# Patient Record
Sex: Female | Born: 1940 | Race: Black or African American | Hispanic: No | Marital: Married | Smoking: Former smoker
Health system: Southern US, Community
[De-identification: ages and names within clinical notes are randomized; demographics above are authoritative.]

## PROBLEM LIST (undated history)

## (undated) DIAGNOSIS — L4050 Arthropathic psoriasis, unspecified: Secondary | ICD-10-CM

## (undated) DIAGNOSIS — R600 Localized edema: Secondary | ICD-10-CM

## (undated) DIAGNOSIS — J45909 Unspecified asthma, uncomplicated: Secondary | ICD-10-CM

## (undated) DIAGNOSIS — I10 Essential (primary) hypertension: Secondary | ICD-10-CM

## (undated) DIAGNOSIS — J302 Other seasonal allergic rhinitis: Secondary | ICD-10-CM

## (undated) DIAGNOSIS — R638 Other symptoms and signs concerning food and fluid intake: Secondary | ICD-10-CM

## (undated) DIAGNOSIS — J449 Chronic obstructive pulmonary disease, unspecified: Secondary | ICD-10-CM

## (undated) DIAGNOSIS — K5730 Diverticulosis of large intestine without perforation or abscess without bleeding: Secondary | ICD-10-CM

## (undated) DIAGNOSIS — Z780 Asymptomatic menopausal state: Secondary | ICD-10-CM

## (undated) DIAGNOSIS — K219 Gastro-esophageal reflux disease without esophagitis: Secondary | ICD-10-CM

## (undated) DIAGNOSIS — Z87442 Personal history of urinary calculi: Secondary | ICD-10-CM

## (undated) DIAGNOSIS — T4145XA Adverse effect of unspecified anesthetic, initial encounter: Secondary | ICD-10-CM

## (undated) DIAGNOSIS — Z8619 Personal history of other infectious and parasitic diseases: Secondary | ICD-10-CM

## (undated) DIAGNOSIS — M1990 Unspecified osteoarthritis, unspecified site: Secondary | ICD-10-CM

## (undated) DIAGNOSIS — R32 Unspecified urinary incontinence: Secondary | ICD-10-CM

## (undated) DIAGNOSIS — N3281 Overactive bladder: Secondary | ICD-10-CM

## (undated) DIAGNOSIS — L900 Lichen sclerosus et atrophicus: Secondary | ICD-10-CM

## (undated) DIAGNOSIS — T8859XA Other complications of anesthesia, initial encounter: Secondary | ICD-10-CM

## (undated) HISTORY — PX: GANGLION CYST EXCISION: SHX1691

## (undated) HISTORY — PX: VEIN SURGERY: SHX48

## (undated) HISTORY — DX: Lichen sclerosus et atrophicus: L90.0

## (undated) HISTORY — PX: OTHER SURGICAL HISTORY: SHX169

## (undated) HISTORY — PX: FRACTURE SURGERY: SHX138

## (undated) HISTORY — PX: VAGINAL HYSTERECTOMY: SUR661

## (undated) HISTORY — DX: Chronic obstructive pulmonary disease, unspecified: J44.9

## (undated) HISTORY — DX: Gastro-esophageal reflux disease without esophagitis: K21.9

## (undated) HISTORY — DX: Arthropathic psoriasis, unspecified: L40.50

## (undated) HISTORY — DX: Personal history of other infectious and parasitic diseases: Z86.19

## (undated) HISTORY — DX: Other symptoms and signs concerning food and fluid intake: R63.8

## (undated) HISTORY — DX: Essential (primary) hypertension: I10

## (undated) HISTORY — DX: Unspecified asthma, uncomplicated: J45.909

## (undated) HISTORY — PX: JOINT REPLACEMENT: SHX530

## (undated) HISTORY — DX: Overactive bladder: N32.81

## (undated) HISTORY — DX: Other seasonal allergic rhinitis: J30.2

## (undated) HISTORY — DX: Unspecified osteoarthritis, unspecified site: M19.90

## (undated) HISTORY — DX: Asymptomatic menopausal state: Z78.0

## (undated) HISTORY — PX: TONSILLECTOMY: SUR1361

---

## 2007-02-26 ENCOUNTER — Ambulatory Visit: Payer: Self-pay | Admitting: Specialist

## 2007-08-20 ENCOUNTER — Ambulatory Visit: Payer: Self-pay | Admitting: Unknown Physician Specialty

## 2008-06-08 ENCOUNTER — Ambulatory Visit: Payer: Self-pay | Admitting: Internal Medicine

## 2008-08-11 ENCOUNTER — Other Ambulatory Visit: Payer: Self-pay

## 2008-08-11 ENCOUNTER — Ambulatory Visit: Payer: Self-pay | Admitting: Cardiovascular Disease

## 2008-08-11 ENCOUNTER — Ambulatory Visit: Payer: Self-pay | Admitting: Unknown Physician Specialty

## 2008-08-18 ENCOUNTER — Ambulatory Visit: Payer: Self-pay | Admitting: Unknown Physician Specialty

## 2009-04-29 ENCOUNTER — Ambulatory Visit: Payer: Self-pay | Admitting: Unknown Physician Specialty

## 2009-06-28 ENCOUNTER — Ambulatory Visit: Payer: Self-pay | Admitting: Unknown Physician Specialty

## 2010-02-04 ENCOUNTER — Ambulatory Visit: Payer: Self-pay

## 2010-04-12 ENCOUNTER — Ambulatory Visit: Payer: Self-pay | Admitting: Pain Medicine

## 2010-06-16 ENCOUNTER — Ambulatory Visit: Payer: Self-pay | Admitting: Dermatology

## 2013-01-19 ENCOUNTER — Ambulatory Visit: Payer: Self-pay | Admitting: Unknown Physician Specialty

## 2013-05-04 ENCOUNTER — Emergency Department: Payer: Self-pay | Admitting: Emergency Medicine

## 2013-06-18 ENCOUNTER — Ambulatory Visit: Payer: Self-pay | Admitting: Unknown Physician Specialty

## 2013-07-21 ENCOUNTER — Ambulatory Visit: Payer: Self-pay | Admitting: Unknown Physician Specialty

## 2014-02-28 DIAGNOSIS — I10 Essential (primary) hypertension: Secondary | ICD-10-CM | POA: Insufficient documentation

## 2014-02-28 DIAGNOSIS — R609 Edema, unspecified: Secondary | ICD-10-CM | POA: Insufficient documentation

## 2014-02-28 DIAGNOSIS — E785 Hyperlipidemia, unspecified: Secondary | ICD-10-CM | POA: Insufficient documentation

## 2014-02-28 DIAGNOSIS — J3089 Other allergic rhinitis: Secondary | ICD-10-CM | POA: Insufficient documentation

## 2014-02-28 DIAGNOSIS — J45909 Unspecified asthma, uncomplicated: Secondary | ICD-10-CM | POA: Insufficient documentation

## 2014-02-28 DIAGNOSIS — E538 Deficiency of other specified B group vitamins: Secondary | ICD-10-CM | POA: Insufficient documentation

## 2014-02-28 DIAGNOSIS — L4050 Arthropathic psoriasis, unspecified: Secondary | ICD-10-CM | POA: Insufficient documentation

## 2014-02-28 DIAGNOSIS — K219 Gastro-esophageal reflux disease without esophagitis: Secondary | ICD-10-CM | POA: Insufficient documentation

## 2014-03-08 DIAGNOSIS — M1990 Unspecified osteoarthritis, unspecified site: Secondary | ICD-10-CM | POA: Insufficient documentation

## 2014-10-01 DIAGNOSIS — IMO0002 Reserved for concepts with insufficient information to code with codable children: Secondary | ICD-10-CM | POA: Insufficient documentation

## 2014-11-04 LAB — HM MAMMOGRAPHY

## 2015-10-11 ENCOUNTER — Encounter: Payer: Self-pay | Admitting: Obstetrics and Gynecology

## 2015-10-26 ENCOUNTER — Ambulatory Visit (INDEPENDENT_AMBULATORY_CARE_PROVIDER_SITE_OTHER): Payer: Medicare Other | Admitting: Obstetrics and Gynecology

## 2015-10-26 ENCOUNTER — Encounter: Payer: Self-pay | Admitting: Obstetrics and Gynecology

## 2015-10-26 VITALS — BP 140/84 | HR 77 | Ht 63.0 in | Wt 216.4 lb

## 2015-10-26 DIAGNOSIS — L900 Lichen sclerosus et atrophicus: Secondary | ICD-10-CM | POA: Diagnosis not present

## 2015-10-26 DIAGNOSIS — Z124 Encounter for screening for malignant neoplasm of cervix: Secondary | ICD-10-CM

## 2015-10-26 DIAGNOSIS — N952 Postmenopausal atrophic vaginitis: Secondary | ICD-10-CM

## 2015-10-26 DIAGNOSIS — Z1239 Encounter for other screening for malignant neoplasm of breast: Secondary | ICD-10-CM | POA: Diagnosis not present

## 2015-10-26 DIAGNOSIS — Z1211 Encounter for screening for malignant neoplasm of colon: Secondary | ICD-10-CM | POA: Diagnosis not present

## 2015-10-26 DIAGNOSIS — N3289 Other specified disorders of bladder: Secondary | ICD-10-CM | POA: Diagnosis not present

## 2015-10-26 MED ORDER — ESTRADIOL 0.1 MG/GM VA CREA: 0.5000 | TOPICAL_CREAM | Freq: Every day | VAGINAL | Status: DC

## 2015-10-26 NOTE — Progress Notes (Signed)
Patient ID: Emily Fox, female   DOB: 04-14-1941, 74 y.o.   MRN: [id number] ANNUAL PREVENTATIVE CARE GYN  ENCOUNTER NOTE  Subjective:       Emily Fox is a 74 y.o. Para [id number]. female here for follow-up on lichen sclerosis and Medicare physical.  Current complaints: 1.  Lichen sclerosis-patient is intermittently using the Randall name Temovate ointment for control of symptoms.  She has not had a flare of vulvar irritation recently.  2.  Vaginal atrophy-patient is using Estrace cream biweekly. 3.  Unstable bladder-taking oxybutynin 5 mg daily Gynecologic History No LMP recorded. Patient has had a hysterectomy.TVH Contraception: status post hysterectomy Last Pap: no pap needed. Results were: normal Last mammogram: 11/05/2014 birad 1. Results were: normal  Obstetric History OB History  Gravida Para Term Preterm AB SAB TAB Ectopic Multiple Living  0 0 0 0 0 0 3    # Outcome Date GA Lbr Len/2nd Weight Sex Delivery Anes PTL Lv  3 Term           2 Term           1 Term               Past Medical History  Diagnosis Date  . Psoriatic arthritis (HCC)   . Osteoarthritis   . History of shingles   . GERD (gastroesophageal reflux disease)   . Hypertension   . Asthma   . Seasonal allergies   . COPD (chronic obstructive pulmonary disease) (HCC)   . Lichen sclerosus   . Increased BMI   . Menopause   . Overactive bladder     Past Surgical History  Procedure Laterality Date  . Knee replacement    . Vaginal hysterectomy    . Vein surgery    . Tonsillectomy    . Ganglion cyst excision      Current Outpatient Prescriptions on File Prior to Visit  Medication Sig Dispense Refill  . aspirin 81 MG tablet Take 81 mg by mouth daily.    . cetirizine (ZYRTEC) 10 MG tablet Take 10 mg by mouth daily.    . clobetasol ointment (TEMOVATE) 0.05 % Apply 1 application topically 2 (two) times daily.    . fluticasone (FLONASE) 50 MCG/ACT nasal spray Place into both nostrils daily.    .  Fluticasone-Salmeterol (ADVAIR DISKUS) 100-50 MCG/DOSE AEPB Inhale 1 puff into the lungs 2 (two) times daily.    . folic acid (FOLVITE) 1 MG tablet Take 1 mg by mouth daily.    Marland Kitchen inFLIXimab (REMICADE) 100 MG injection Inject into the vein.    . methyldopa (ALDOMET) 500 MG tablet Take 500 mg by mouth 3 (three) times daily.    . montelukast (SINGULAIR) 10 MG tablet Take 10 mg by mouth at bedtime.    Marland Kitchen omeprazole (PRILOSEC) 40 MG capsule Take 40 mg by mouth daily.    Marland Kitchen oxybutynin (DITROPAN-XL) 5 MG 24 hr tablet Take 5 mg by mouth at bedtime.    . triamterene-hydrochlorothiazide (DYAZIDE) 37.5-25 MG capsule Take 1 capsule by mouth daily.    . methotrexate (RHEUMATREX) 2.5 MG tablet Take 2.5 mg by mouth once a week. Caution:Chemotherapy. Protect from light.     No current facility-administered medications on file prior to visit.    Allergies  Allergen Reactions  . Hylan G-F 20 Shortness Of Breath  . Penicillins Swelling    Social History   Social History  . Marital Status: Married    Spouse Name: N/A  .  Number of Children: N/A  . Years of Education: N/A   Occupational History  . Not on file.   Social History Main Topics  . Smoking status: Former Games developermoker  . Smokeless tobacco: Not on file  . Alcohol Use: No  . Drug Use: No  . Sexual Activity: Not Currently   Other Topics Concern  . Not on file   Social History Narrative    Family History  Problem Relation Age of Onset  . Cancer Neg Hx   . Diabetes Neg Hx   . Heart disease Neg Hx     The following portions of the patient's history were reviewed and updated as appropriate: allergies, current medications, past family history, past medical history, past social history, past surgical history and problem list.  Review of Systems ROS Review of Systems - General ROS: negative for - chills, fatigue, fever, hot flashes, night sweats, weight gain or weight loss Psychological ROS: negative for - anxiety, decreased libido,  depression, mood swings, physical abuse or sexual abuse Ophthalmic ROS: negative for - blurry vision, eye pain or loss of vision ENT ROS: negative for - headaches, hearing change, visual changes or vocal changes Allergy and Immunology ROS: negative for - hives, itchy/watery eyes or seasonal allergies Hematological and Lymphatic ROS: negative for - bleeding problems, bruising, swollen lymph nodes or weight loss Endocrine ROS: negative for - galactorrhea, hair pattern changes, hot flashes, malaise/lethargy, mood swings, palpitations, polydipsia/polyuria, skin changes, temperature intolerance or unexpected weight changes Breast ROS: negative for - new or changing breast lumps or nipple discharge Respiratory ROS: negative for - cough or shortness of breath Cardiovascular ROS: negative for - chest pain, irregular heartbeat, palpitations or shortness of breath Gastrointestinal ROS: no abdominal pain, change in bowel habits, or black or bloody stools Genito-Urinary ROS: no dysuria, trouble voiding, or hematuria Musculoskeletal ROS: negative for - joint pain or joint stiffness Neurological ROS: negative for - bowel and bladder control changes Dermatological ROS: negative for rash and skin lesion changes   Objective:   BP 140/84 mmHg  Pulse 77  Ht 5\' 3"  (1.6 m)  Wt 216 lb 6.4 oz (98.158 kg)  BMI 38.34 kg/m2 CONSTITUTIONAL: Well-developed, well-nourished female in no acute distress.  PSYCHIATRIC: Normal mood and affect. Normal behavior. Normal judgment and thought content. NEUROLGIC: Alert and oriented to person, place, and time. Normal muscle tone coordination. No cranial nerve deficit noted. HENT:  Normocephalic, atraumatic, External right and left ear normal. Oropharynx is clear and moist EYES: Conjunctivae  normal. No scleral icterus.  NECK: Normal range of motion, supple, no masses.  Normal thyroid.  SKIN: Skin is warm and dry. No rash noted. Not diaphoretic. No erythema. No  pallor. CARDIOVASCULAR: Not examined RESPIRATORY: Not examined BREASTS: Symmetric in size. No masses, skin changes, nipple drainage, or lymphadenopathy. ABDOMEN: Soft, normal bowel sounds, no distention noted.  No tenderness, rebound or guarding.  BLADDER: Normal PELVIC:  External Genitalia: Atrophic changes present; periclitoral leukoplakia and labia minora.  Small skin breakdown with linear changes noted at the posterior fourchette.  Single digit vaginal exam is notable for a narrowed introitus without palpable pelvic or adnexal mass  BUS: Normal  Vagina: Atrophic changes; no palpable masses  Cervix: Surgically absent  Uterus:Surgically absent  Adnexa: Normal  RV: External Exam NormaI, No Rectal Masses and Normal Sphincter tone  MUSCULOSKELETAL: Normal range of motion. No tenderness.  No cyanosis, clubbing, or edema.  2+ distal pulses. LYMPHATIC: No Axillary, Supraclavicular, or Inguinal Adenopathy.    Assessment:  Annual gynecologic examination 74 y.o. Contraception: status post hysterectomy(TVH) bmi-38 Lichen sclerosus-stable. Vaginal atrophy-stable  Plan:  Pap: Not needed Mammogram: Ordered Stool Guaiac Testing:  Ordered Labs: thur pcp Routine preventative health maintenance measures emphasized: Exercise/Diet/Weight control, Tobacco Warnings and Alcohol/Substance use risks Continue Temovate ointment twice a week. Continue Estrace cream twice a week Return to Clinic - 1 YearFor Medicare physical Return to clinic.-6 months-Follow-up on lichen sclerosus   Herold Harms, MD  Note: This dictation was prepared with Dragon dictation along with smaller phrase technology. Any transcriptional errors that result from this process are unintentional.

## 2015-10-27 ENCOUNTER — Encounter: Payer: Self-pay | Admitting: Obstetrics and Gynecology

## 2015-10-27 DIAGNOSIS — N952 Postmenopausal atrophic vaginitis: Secondary | ICD-10-CM | POA: Insufficient documentation

## 2015-10-27 DIAGNOSIS — L900 Lichen sclerosus et atrophicus: Secondary | ICD-10-CM | POA: Insufficient documentation

## 2015-10-27 DIAGNOSIS — N3289 Other specified disorders of bladder: Secondary | ICD-10-CM | POA: Insufficient documentation

## 2015-10-27 NOTE — Patient Instructions (Addendum)
1.  Continue with Temovate ointment twice a week. 2.  Continue with Estrace cream intravaginally twice a week. 3.  Return in 6 months for follow-up. 4.  Return in 1 year for a Medicare physical 5.  Continue oxybutynin SR 5 mg daily 6.  Mammogram ordered. 7.  Stool guaiac cards ordered.

## 2015-10-31 ENCOUNTER — Other Ambulatory Visit: Payer: Self-pay | Admitting: Internal Medicine

## 2015-10-31 DIAGNOSIS — R109 Unspecified abdominal pain: Secondary | ICD-10-CM

## 2015-11-03 ENCOUNTER — Other Ambulatory Visit: Payer: Self-pay | Admitting: Obstetrics and Gynecology

## 2015-11-04 ENCOUNTER — Ambulatory Visit
Admission: RE | Admit: 2015-11-04 | Discharge: 2015-11-04 | Disposition: A | Discharge status: Home / Self Care | Payer: Medicare Other | Source: Ambulatory Visit | Attending: Internal Medicine | Admitting: Internal Medicine

## 2015-11-04 DIAGNOSIS — I708 Atherosclerosis of other arteries: Secondary | ICD-10-CM | POA: Insufficient documentation

## 2015-11-04 DIAGNOSIS — R109 Unspecified abdominal pain: Secondary | ICD-10-CM | POA: Diagnosis present

## 2015-11-04 MED ORDER — IOHEXOL 300 MG/ML  SOLN
100.0000 mL | Freq: Once | INTRAMUSCULAR | Status: AC | PRN
  Administered 2015-11-04: 100 mL via INTRAVENOUS

## 2015-11-09 LAB — FECAL OCCULT BLOOD, IMMUNOCHEMICAL: Fecal Occult Bld: NEGATIVE

## 2015-12-29 ENCOUNTER — Telehealth: Payer: Self-pay | Admitting: Obstetrics and Gynecology

## 2015-12-29 MED ORDER — OXYBUTYNIN CHLORIDE ER 5 MG PO TB24: 5.0000 mg | ORAL_TABLET | Freq: Every day | ORAL | Status: DC

## 2015-12-29 NOTE — Telephone Encounter (Signed)
Patient called requesting a refill on oxybutinin sent to [facility].thanks

## 2015-12-29 NOTE — Telephone Encounter (Signed)
Pt aware med erx. 

## 2016-04-25 ENCOUNTER — Ambulatory Visit (INDEPENDENT_AMBULATORY_CARE_PROVIDER_SITE_OTHER): Payer: Medicare Other | Admitting: Obstetrics and Gynecology

## 2016-04-25 ENCOUNTER — Encounter: Payer: Self-pay | Admitting: Obstetrics and Gynecology

## 2016-04-25 VITALS — BP 148/80 | HR 86 | Ht 63.0 in | Wt 219.2 lb

## 2016-04-25 DIAGNOSIS — N952 Postmenopausal atrophic vaginitis: Secondary | ICD-10-CM

## 2016-04-25 DIAGNOSIS — N3289 Other specified disorders of bladder: Secondary | ICD-10-CM | POA: Diagnosis not present

## 2016-04-25 DIAGNOSIS — L900 Lichen sclerosus et atrophicus: Secondary | ICD-10-CM

## 2016-04-25 MED ORDER — CLOBETASOL PROPIONATE 0.05 % EX OINT: 1.0000 "application " | TOPICAL_OINTMENT | Freq: Two times a day (BID) | CUTANEOUS | Status: DC

## 2016-04-25 NOTE — Patient Instructions (Signed)
1. Continue Temovate ointment once or twice a week as needed 2. Continue Estrace cream intravaginal twice a week 3. Return in 6 months for follow-up

## 2016-04-25 NOTE — Progress Notes (Signed)
Chief complaint: 1.  Lichen sclerosus. 2.  Vaginal atrophy. 3.  Unstable bladder  Patient presents for 6 month follow-up.  She is only using the Temovate ointment twice a month instead of twice a week as recommended because of cost constraints for the medication.  She is using Estrace cream, intravaginal once or twice a week.  She is on oxybutynin for unstable bladder.  Lichen sclerosis, symptoms are present without recent exacerbation. Vaginal atrophy remains stable without significant patient complaints. Bladder leakage is minimal.  No LMP recorded. Patient has had a hysterectomy.TVH Contraception: status post hysterectomy Last Pap: no pap needed. Results were: normal Last mammogram: 11/05/2014 birad 1. Results were: normal  Past Medical History  Diagnosis Date  . Psoriatic arthritis (HCC)   . Osteoarthritis   . History of shingles   . GERD (gastroesophageal reflux disease)   . Hypertension   . Asthma   . Seasonal allergies   . COPD (chronic obstructive pulmonary disease) (HCC)   . Lichen sclerosus   . Increased BMI   . Menopause   . Overactive bladder    Past Surgical History  Procedure Laterality Date  . Knee replacement    . Vaginal hysterectomy    . Vein surgery    . Tonsillectomy    . Ganglion cyst excision      OBJECTIVE: BP 148/80 mmHg  Pulse 86  Ht 5\' 3"  (1.6 m)  Wt 219 lb 3.2 oz (99.428 kg)  BMI 38.84 kg/m2 ]ABDOMEN: Soft, normal bowel sounds, no distention noted. No tenderness, rebound or guarding.  BLADDER: Normal PELVIC: External Genitalia: Atrophic changes present; periclitoral leukoplakia and labia minora-Minimal. Small skin breakdown with linear changes noted at the posterior fourchette. Single digit vaginal exam is notable for a narrowed introitus without palpable pelvic or adnexal mass BUS: Normal Vagina: Atrophic changes; no palpable masses Cervix: Surgically  absent Uterus:Surgically absent Adnexa: Normal RV: External Exam NormaI  ASSESSMENT: 1. Lichen sclerosus, stable despite reduced frequency of Temovate ointment therapy.  Patient only able to use name brand medication as generics are ineffective. 2.  Vaginal atrophy, stable. 3.  Unstable bladder, without recent change in symptoms  PLAN: 1.  Increase Temovate ointment to at least weekly application. 2.  Continue Estrace cream for vaginal once or twice a week. 3.  Continue with oxybutynin 5 mg at bedtime. 4.  Return in 6 months for follow-up  A total of 15 minutes were spent face-to-face with the patient during this encounter and over half of that time dealt with counseling and coordination of care.  Herold HarmsMartin A Sonyia Muro, MD  Note: This dictation was prepared with Dragon dictation along with smaller phrase technology. Any transcriptional errors that result from this process are unintentional.

## 2016-09-04 ENCOUNTER — Other Ambulatory Visit: Payer: Self-pay

## 2016-09-04 MED ORDER — OXYBUTYNIN CHLORIDE ER 5 MG PO TB24: 5.0000 mg | ORAL_TABLET | Freq: Every day | ORAL | 6 refills | Status: DC

## 2016-10-22 NOTE — Progress Notes (Signed)
Patient ID: Emily Fox, female   DOB: June 20, 1941, 75 y.o.   MRN: [id number] ANNUAL PREVENTATIVE CARE GYN  ENCOUNTER NOTE  Subjective:       Emily Fox is a 75 y.o. Para [id number]. female here for follow-up on lichen sclerosis and Medicare physical.  Current complaints: 1.  Lichen sclerosis-patient is intermittently using the Brand name Temovate ointment for control of symptoms.  She has not had a flare of vulvar irritation recently.  2.  Vaginal atrophy-patient is using Estrace cream biweekly. 3.  Unstable bladder-taking oxybutynin 5 mg daily  Gynecologic History No LMP recorded. Patient has had a hysterectomy.TVH Contraception: status post hysterectomy Last Pap: no pap needed. Results were: normal Last mammogram: 11/05/2014 birad 1. Results were: normal  Obstetric History OB History  Gravida Para Term Preterm AB Living  3 3 3  0 0 3  SAB TAB Ectopic Multiple Live Births  0 0 0 0      # Outcome Date GA Lbr Len/2nd Weight Sex Delivery Anes PTL Lv  3 Term           2 Term           1 Term               Past Medical History:  Diagnosis Date  . Asthma   . COPD (chronic obstructive pulmonary disease) (HCC)   . GERD (gastroesophageal reflux disease)   . History of shingles   . Hypertension   . Increased BMI   . Lichen sclerosus   . Menopause   . Osteoarthritis   . Overactive bladder   . Psoriatic arthritis (HCC)   . Seasonal allergies     Past Surgical History:  Procedure Laterality Date  . GANGLION CYST EXCISION    . knee replacement    . TONSILLECTOMY    . VAGINAL HYSTERECTOMY    . VEIN SURGERY      Current Outpatient Prescriptions on File Prior to Visit  Medication Sig Dispense Refill  . albuterol (PROAIR HFA) 108 (90 BASE) MCG/ACT inhaler Inhale into the lungs.    Marland Kitchen. aspirin 81 MG tablet Take 81 mg by mouth daily.    . cetirizine (ZYRTEC) 10 MG tablet Take 10 mg by mouth daily.    . clindamycin (CLEOCIN) 150 MG capsule once daily as needed (for teeth  cleaning).     . clobetasol ointment (TEMOVATE) 0.05 % Apply 1 application topically 2 (two) times daily. 30 g 1  . estradiol (ESTRACE) 0.1 MG/GM vaginal cream Place 0.5 Applicatorfuls vaginally at bedtime. 1/2 gram twice a week 42.5 g 3  . fluticasone (FLONASE) 50 MCG/ACT nasal spray Place into both nostrils daily.    . Fluticasone-Salmeterol (ADVAIR DISKUS) 100-50 MCG/DOSE AEPB Inhale 1 puff into the lungs 2 (two) times daily.    . folic acid (FOLVITE) 1 MG tablet Take 1 mg by mouth daily.    . hyoscyamine (LEVSIN) 0.125 MG/5ML ELIX Take 0.125 mg by mouth.    . inFLIXimab (REMICADE) 100 MG injection Inject into the vein.    . methotrexate (RHEUMATREX) 2.5 MG tablet Take 2.5 mg by mouth once a week. Caution:Chemotherapy. Protect from light.    . methyldopa (ALDOMET) 500 MG tablet Take 500 mg by mouth 3 (three) times daily.    . montelukast (SINGULAIR) 10 MG tablet Take 10 mg by mouth at bedtime.    . Multiple Vitamin (MULTI-VITAMINS) TABS Take by mouth.    . Omega-3 Fatty Acids (FISH OIL CONCENTRATE) 300 MG CAPS  Take by mouth.    Marland Kitchen. omeprazole (PRILOSEC) 40 MG capsule Take 40 mg by mouth daily.    Marland Kitchen. oxybutynin (DITROPAN-XL) 5 MG 24 hr tablet Take 1 tablet (5 mg total) by mouth at bedtime. 30 tablet 6  . potassium chloride (K-DUR) 10 MEQ tablet Take by mouth.    . ranitidine (ZANTAC) 300 MG tablet     . triamterene-hydrochlorothiazide (DYAZIDE) 37.5-25 MG capsule Take 1 capsule by mouth daily.     No current facility-administered medications on file prior to visit.     Allergies  Allergen Reactions  . Hylan G-F 20 Shortness Of Breath  . Penicillins Swelling    Social History   Social History  . Marital status: Married    Spouse name: N/A  . Number of children: N/A  . Years of education: N/A   Occupational History  . Not on file.   Social History Main Topics  . Smoking status: Former Games developermoker  . Smokeless tobacco: Not on file  . Alcohol use No  . Drug use: No  . Sexual  activity: Not Currently   Other Topics Concern  . Not on file   Social History Narrative  . No narrative on file    Family History  Problem Relation Age of Onset  . Cancer Neg Hx   . Diabetes Neg Hx   . Heart disease Neg Hx     The following portions of the patient's history were reviewed and updated as appropriate: allergies, current medications, past family history, past medical history, past social history, past surgical history and problem list.  Review of Systems ROS Review of Systems - General ROS: negative for - chills, fatigue, fever, hot flashes, night sweats, weight gain or weight loss Psychological ROS: negative for - anxiety, decreased libido, depression, mood swings, physical abuse or sexual abuse Ophthalmic ROS: negative for - blurry vision, eye pain or loss of vision ENT ROS: negative for - headaches, hearing change, visual changes or vocal changes Allergy and Immunology ROS: negative for - hives, itchy/watery eyes or seasonal allergies Hematological and Lymphatic ROS: negative for - bleeding problems, bruising, swollen lymph nodes or weight loss Endocrine ROS: negative for - galactorrhea, hair pattern changes, hot flashes, malaise/lethargy, mood swings, palpitations, polydipsia/polyuria, skin changes, temperature intolerance or unexpected weight changes Breast ROS: negative for - new or changing breast lumps or nipple discharge Respiratory ROS: negative for - cough or shortness of breath Cardiovascular ROS: negative for - chest pain, irregular heartbeat, palpitations or shortness of breath Gastrointestinal ROS: no abdominal pain, change in bowel habits, or black or bloody stools Genito-Urinary ROS: no dysuria, trouble voiding, or hematuria Musculoskeletal ROS: negative for - joint pain or joint stiffness Neurological ROS: negative for - bowel and bladder control changes Dermatological ROS: negative for rash and skin lesion changes   Objective:   BP (!) 141/80    Pulse 76   Ht 5\' 3"  (1.6 m)   Wt 219 lb (99.3 kg)   BMI 38.79 kg/m  CONSTITUTIONAL: Well-developed, well-nourished female in no acute distress.  PSYCHIATRIC: Normal mood and affect. Normal behavior. Normal judgment and thought content. NEUROLGIC: Alert and oriented to person, place, and time. Normal muscle tone coordination. No cranial nerve deficit noted. HENT:  Normocephalic, atraumatic EYES: Conjunctivae  normal. No scleral icterus.  NECK: Normal range of motion, supple, no masses.  Normal thyroid.  SKIN: Skin is warm and dry. No rash noted. Not diaphoretic. No erythema. No pallor. CARDIOVASCULAR: Not examined RESPIRATORY: Not examined BREASTS:  Symmetric in size. No masses, skin changes, nipple drainage, or lymphadenopathy. ABDOMEN: Soft, normal bowel sounds, no distention noted.  No tenderness, rebound or guarding.  BLADDER: Normal PELVIC:  External Genitalia: Atrophic changes present; periclitoral leukoplakia and labia minora agglutination present.  Minimal leukoplakia noted at the posterior fourchette.  Single digit vaginal exam is notable for a narrowed introitus without palpable pelvic or adnexal mass  BUS: Normal  Vagina: Atrophic changes; no palpable masses  Cervix: Surgically absent  Uterus:Surgically absent  Adnexa: Normal  RV: External Exam NormaI, No Rectal Masses and Normal Sphincter tone  MUSCULOSKELETAL: Normal range of motion. No tenderness.  No cyanosis, clubbing, or edema.  2+ distal pulses. LYMPHATIC: No Axillary, Supraclavicular, or Inguinal Adenopathy.    Assessment:   Annual gynecologic examination 75 y.o. Contraception: status post hysterectomy(TVH) bmi-38 Lichen sclerosus-stable. Vaginal atrophy-stable  Plan:  Pap: Not needed Mammogram: Ordered Stool Guaiac Testing:  Ordered Labs: thur pcp Routine preventative health maintenance measures emphasized: Exercise/Diet/Weight control, Tobacco Warnings and Alcohol/Substance use risks Continue Temovate  ointment twice a week. Continue Estrace cream twice a week Continue oxybutynin 5 mg daily at bedtime Return to Clinic - 1 YearFor Medicare physical Return to clinic.-6 months-Follow-up on lichen sclerosus   Crystal Hyacinth Meeker, CMA  Herold Harms, MD  Note: This dictation was prepared with Dragon dictation along with smaller phrase technology. Any transcriptional errors that result from this process are unintentional.

## 2016-10-24 ENCOUNTER — Encounter: Payer: Self-pay | Admitting: Obstetrics and Gynecology

## 2016-10-24 ENCOUNTER — Ambulatory Visit (INDEPENDENT_AMBULATORY_CARE_PROVIDER_SITE_OTHER): Payer: Medicare Other | Admitting: Obstetrics and Gynecology

## 2016-10-24 VITALS — BP 141/80 | HR 76 | Ht 63.0 in | Wt 219.0 lb

## 2016-10-24 DIAGNOSIS — Z124 Encounter for screening for malignant neoplasm of cervix: Secondary | ICD-10-CM

## 2016-10-24 DIAGNOSIS — Z1211 Encounter for screening for malignant neoplasm of colon: Secondary | ICD-10-CM

## 2016-10-24 DIAGNOSIS — Z1239 Encounter for other screening for malignant neoplasm of breast: Secondary | ICD-10-CM

## 2016-10-24 DIAGNOSIS — N3289 Other specified disorders of bladder: Secondary | ICD-10-CM | POA: Diagnosis not present

## 2016-10-24 DIAGNOSIS — N952 Postmenopausal atrophic vaginitis: Secondary | ICD-10-CM

## 2016-10-24 DIAGNOSIS — L900 Lichen sclerosus et atrophicus: Secondary | ICD-10-CM

## 2016-10-24 MED ORDER — OXYBUTYNIN CHLORIDE ER 5 MG PO TB24: 5.0000 mg | ORAL_TABLET | Freq: Every day | ORAL | 6 refills | Status: DC

## 2016-10-24 MED ORDER — CLOBETASOL PROPIONATE 0.05 % EX OINT: 1.0000 "application " | TOPICAL_OINTMENT | Freq: Two times a day (BID) | CUTANEOUS | 1 refills | Status: DC

## 2016-10-24 NOTE — Patient Instructions (Addendum)
1. No Pap smear is needed 2. Mammogram is ordered 3. Stool guaiac cards are given 4. Screening labs are to be obtained through primary care 5. Temovate ointment 0.05% is refilled and is to be applied topically twice a week 6. Return in 6 months for follow-up 7. Return in 1 year for annual Medicare physical 8. Continue Estrace cream intravaginal twice a week 9. Continue oxybutynin 5 mg daily at bedtime    Health Maintenance for Postmenopausal Women Introduction Menopause is a normal process in which your reproductive ability comes to an end. This process happens gradually over a span of months to years, usually between the ages of 86 and 82. Menopause is complete when you have missed 12 consecutive menstrual periods. It is important to talk with your health care provider about some of the most common conditions that affect postmenopausal women, such as heart disease, cancer, and bone loss (osteoporosis). Adopting a healthy lifestyle and getting preventive care can help to promote your health and wellness. Those actions can also lower your chances of developing some of these common conditions. What should I know about menopause? During menopause, you may experience a number of symptoms, such as:  Moderate-to-severe hot flashes.  Night sweats.  Decrease in sex drive.  Mood swings.  Headaches.  Tiredness.  Irritability.  Memory problems.  Insomnia. Choosing to treat or not to treat menopausal changes is an individual decision that you make with your health care provider. What should I know about hormone replacement therapy and supplements? Hormone therapy products are effective for treating symptoms that are associated with menopause, such as hot flashes and night sweats. Hormone replacement carries certain risks, especially as you become older. If you are thinking about using estrogen or estrogen with progestin treatments, discuss the benefits and risks with your health care  provider. What should I know about heart disease and stroke? Heart disease, heart attack, and stroke become more likely as you age. This may be due, in part, to the hormonal changes that your body experiences during menopause. These can affect how your body processes dietary fats, triglycerides, and cholesterol. Heart attack and stroke are both medical emergencies. There are many things that you can do to help prevent heart disease and stroke:  Have your blood pressure checked at least every 1-2 years. High blood pressure causes heart disease and increases the risk of stroke.  If you are 16-28 years old, ask your health care provider if you should take aspirin to prevent a heart attack or a stroke.  Do not use any tobacco products, including cigarettes, chewing tobacco, or electronic cigarettes. If you need help quitting, ask your health care provider.  It is important to eat a healthy diet and maintain a healthy weight.  Be sure to include plenty of vegetables, fruits, low-fat dairy products, and lean protein.  Avoid eating foods that are high in solid fats, added sugars, or salt (sodium).  Get regular exercise. This is one of the most important things that you can do for your health.  Try to exercise for at least 150 minutes each week. The type of exercise that you do should increase your heart rate and make you sweat. This is known as moderate-intensity exercise.  Try to do strengthening exercises at least twice each week. Do these in addition to the moderate-intensity exercise.  Know your numbers.Ask your health care provider to check your cholesterol and your blood glucose. Continue to have your blood tested as directed by your health care  provider. What should I know about cancer screening? There are several types of cancer. Take the following steps to reduce your risk and to catch any cancer development as early as possible. Breast Cancer  Practice breast self-awareness.  This  means understanding how your breasts normally appear and feel.  It also means doing regular breast self-exams. Let your health care provider know about any changes, no matter how small.  If you are 89 or older, have a clinician do a breast exam (clinical breast exam or CBE) every year. Depending on your age, family history, and medical history, it may be recommended that you also have a yearly breast X-ray (mammogram).  If you have a family history of breast cancer, talk with your health care provider about genetic screening.  If you are at high risk for breast cancer, talk with your health care provider about having an MRI and a mammogram every year.  Breast cancer (BRCA) gene test is recommended for women who have family members with BRCA-related cancers. Results of the assessment will determine the need for genetic counseling and BRCA1 and for BRCA2 testing. BRCA-related cancers include these types:  Breast. This occurs in males or females.  Ovarian.  Tubal. This may also be called fallopian tube cancer.  Cancer of the abdominal or pelvic lining (peritoneal cancer).  Prostate.  Pancreatic. Cervical, Uterine, and Ovarian Cancer  Your health care provider may recommend that you be screened regularly for cancer of the pelvic organs. These include your ovaries, uterus, and vagina. This screening involves a pelvic exam, which includes checking for microscopic changes to the surface of your cervix (Pap test).  For women ages 21-65, health care providers may recommend a pelvic exam and a Pap test every three years. For women ages 67-65, they may recommend the Pap test and pelvic exam, combined with testing for human papilloma virus (HPV), every five years. Some types of HPV increase your risk of cervical cancer. Testing for HPV may also be done on women of any age who have unclear Pap test results.  Other health care providers may not recommend any screening for nonpregnant women who are  considered low risk for pelvic cancer and have no symptoms. Ask your health care provider if a screening pelvic exam is right for you.  If you have had past treatment for cervical cancer or a condition that could lead to cancer, you need Pap tests and screening for cancer for at least 20 years after your treatment. If Pap tests have been discontinued for you, your risk factors (such as having a new sexual partner) need to be reassessed to determine if you should start having screenings again. Some women have medical problems that increase the chance of getting cervical cancer. In these cases, your health care provider may recommend that you have screening and Pap tests more often.  If you have a family history of uterine cancer or ovarian cancer, talk with your health care provider about genetic screening.  If you have vaginal bleeding after reaching menopause, tell your health care provider.  There are currently no reliable tests available to screen for ovarian cancer. Lung Cancer  Lung cancer screening is recommended for adults 14-87 years old who are at high risk for lung cancer because of a history of smoking. A yearly low-dose CT scan of the lungs is recommended if you:  Currently smoke.  Have a history of at least 30 pack-years of smoking and you currently smoke or have quit within the  past 15 years. A pack-year is smoking an average of one pack of cigarettes per day for one year. Yearly screening should:  Continue until it has been 15 years since you quit.  Stop if you develop a health problem that would prevent you from having lung cancer treatment. Colorectal Cancer  This type of cancer can be detected and can often be prevented.  Routine colorectal cancer screening usually begins at age 12 and continues through age 19.  If you have risk factors for colon cancer, your health care provider may recommend that you be screened at an earlier age.  If you have a family history of  colorectal cancer, talk with your health care provider about genetic screening.  Your health care provider may also recommend using home test kits to check for hidden blood in your stool.  A small camera at the end of a tube can be used to examine your colon directly (sigmoidoscopy or colonoscopy). This is done to check for the earliest forms of colorectal cancer.  Direct examination of the colon should be repeated every 5-10 years until age 25. However, if early forms of precancerous polyps or small growths are found or if you have a family history or genetic risk for colorectal cancer, you may need to be screened more often. Skin Cancer  Check your skin from head to toe regularly.  Monitor any moles. Be sure to tell your health care provider:  About any new moles or changes in moles, especially if there is a change in a mole's shape or color.  If you have a mole that is larger than the size of a pencil eraser.  If any of your family members has a history of skin cancer, especially at a young age, talk with your health care provider about genetic screening.  Always use sunscreen. Apply sunscreen liberally and repeatedly throughout the day.  Whenever you are outside, protect yourself by wearing long sleeves, pants, a wide-brimmed hat, and sunglasses. What should I know about osteoporosis? Osteoporosis is a condition in which bone destruction happens more quickly than new bone creation. After menopause, you may be at an increased risk for osteoporosis. To help prevent osteoporosis or the bone fractures that can happen because of osteoporosis, the following is recommended:  If you are 35-58 years old, get at least 1,000 mg of calcium and at least 600 mg of vitamin D per day.  If you are older than age 86 but younger than age 53, get at least 1,200 mg of calcium and at least 600 mg of vitamin D per day.  If you are older than age 31, get at least 1,200 mg of calcium and at least 800 mg of  vitamin D per day. Smoking and excessive alcohol intake increase the risk of osteoporosis. Eat foods that are rich in calcium and vitamin D, and do weight-bearing exercises several times each week as directed by your health care provider. What should I know about how menopause affects my mental health? Depression may occur at any age, but it is more common as you become older. Common symptoms of depression include:  Low or sad mood.  Changes in sleep patterns.  Changes in appetite or eating patterns.  Feeling an overall lack of motivation or enjoyment of activities that you previously enjoyed.  Frequent crying spells. Talk with your health care provider if you think that you are experiencing depression. What should I know about immunizations? It is important that you get and maintain  your immunizations. These include:  Tetanus, diphtheria, and pertussis (Tdap) booster vaccine.  Influenza every year before the flu season begins.  Pneumonia vaccine.  Shingles vaccine. Your health care provider may also recommend other immunizations. This information is not intended to replace advice given to you by your health care provider. Make sure you discuss any questions you have with your health care provider. Document Released: 12/28/2005 Document Revised: 05/25/2016 Document Reviewed: 08/09/2015  2017 [employer]vier

## 2016-11-14 LAB — FECAL OCCULT BLOOD, IMMUNOCHEMICAL: Fecal Occult Bld: NEGATIVE

## 2016-11-21 ENCOUNTER — Encounter
Admission: RE | Admit: 2016-11-21 | Discharge: 2016-11-21 | Disposition: A | Discharge status: Home / Self Care | Payer: Medicare Other | Source: Ambulatory Visit | Attending: Orthopedic Surgery | Admitting: Orthopedic Surgery

## 2016-11-21 DIAGNOSIS — Z01818 Encounter for other preprocedural examination: Secondary | ICD-10-CM | POA: Insufficient documentation

## 2016-11-21 DIAGNOSIS — I10 Essential (primary) hypertension: Secondary | ICD-10-CM | POA: Diagnosis not present

## 2016-11-21 HISTORY — DX: Localized edema: R60.0

## 2016-11-21 HISTORY — DX: Other complications of anesthesia, initial encounter: T88.59XA

## 2016-11-21 HISTORY — DX: Unspecified urinary incontinence: R32

## 2016-11-21 HISTORY — DX: Adverse effect of unspecified anesthetic, initial encounter: T41.45XA

## 2016-11-21 HISTORY — DX: Diverticulosis of large intestine without perforation or abscess without bleeding: K57.30

## 2016-11-21 LAB — URINALYSIS, ROUTINE W REFLEX MICROSCOPIC
BACTERIA UA: NONE SEEN
Bilirubin Urine: NEGATIVE
GLUCOSE, UA: NEGATIVE mg/dL
Hgb urine dipstick: NEGATIVE
Ketones, ur: 5 mg/dL — AB
Nitrite: NEGATIVE
PH: 7 (ref 5.0–8.0)
PROTEIN: 30 mg/dL — AB
Specific Gravity, Urine: 1.021 (ref 1.005–1.030)

## 2016-11-21 LAB — TYPE AND SCREEN
ABO/RH(D): O POS
Antibody Screen: NEGATIVE

## 2016-11-21 LAB — SURGICAL PCR SCREEN
MRSA, PCR: NEGATIVE
STAPHYLOCOCCUS AUREUS: NEGATIVE

## 2016-11-21 LAB — CBC
HCT: 37.3 % (ref 35.0–47.0)
Hemoglobin: 12.7 g/dL (ref 12.0–16.0)
MCH: 28.2 pg (ref 26.0–34.0)
MCHC: 33.9 g/dL (ref 32.0–36.0)
MCV: 83 fL (ref 80.0–100.0)
PLATELETS: 212 10*3/uL (ref 150–440)
RBC: 4.5 MIL/uL (ref 3.80–5.20)
RDW: 15.1 % — AB (ref 11.5–14.5)
WBC: 7.5 10*3/uL (ref 3.6–11.0)

## 2016-11-21 LAB — COMPREHENSIVE METABOLIC PANEL
ALT: 13 U/L — ABNORMAL LOW (ref 14–54)
ANION GAP: 7 (ref 5–15)
AST: 25 U/L (ref 15–41)
Albumin: 4.2 g/dL (ref 3.5–5.0)
Alkaline Phosphatase: 61 U/L (ref 38–126)
BUN: 16 mg/dL (ref 6–20)
CHLORIDE: 103 mmol/L (ref 101–111)
CO2: 29 mmol/L (ref 22–32)
Calcium: 9.4 mg/dL (ref 8.9–10.3)
Creatinine, Ser: 0.71 mg/dL (ref 0.44–1.00)
GFR calc non Af Amer: >60 mL/min (ref >60)
Glucose, Bld: 98 mg/dL (ref 65–99)
Potassium: 3.6 mmol/L (ref 3.5–5.1)
SODIUM: 139 mmol/L (ref 135–145)
Total Bilirubin: 0.5 mg/dL (ref 0.3–1.2)
Total Protein: 7.6 g/dL (ref 6.5–8.1)

## 2016-11-21 LAB — SEDIMENTATION RATE: Sed Rate: 28 mm/hr (ref 0–30)

## 2016-11-21 LAB — C-REACTIVE PROTEIN: CRP: 1.6 mg/dL — AB (ref <1.0)

## 2016-11-21 LAB — PROTIME-INR
INR: 1.01
PROTHROMBIN TIME: 13.3 s (ref 11.4–15.2)

## 2016-11-21 LAB — APTT: aPTT: 37 seconds — ABNORMAL HIGH (ref 24–36)

## 2016-11-21 NOTE — Pre-Procedure Instructions (Signed)
Abnormal UA result faxed to Dr Elenor LegatoHooten's office.

## 2016-11-21 NOTE — Patient Instructions (Signed)
  Your procedure is scheduled on: 12/05/16 Wed Report to Same Day Surgery 2nd floor medical mall [facility](Medical Mall Entrance-take elevator on left to 2nd floor.  Check in with surgery information desk.) To find out your arrival time please call [phone] between 1PM - 3PM on 12/04/16 Tues  Remember: Instructions that are not followed completely may result in serious medical risk, up to and including death, or upon the discretion of your surgeon and anesthesiologist your surgery may need to be rescheduled.    _x___ 1. Do not eat food or drink liquids after midnight. No gum chewing or hard candies.     __x__ 2. No Alcohol for 24 hours before or after surgery.   __x__3. No Smoking for 24 prior to surgery.   ____  4. Bring all medications with you on the day of surgery if instructed.    __x__ 5. Notify your doctor if there is any change in your medical condition     (cold, fever, infections).     Do not wear jewelry, make-up, hairpins, clips or nail polish.  Do not wear lotions, powders, or perfumes. You may wear deodorant.  Do not shave 48 hours prior to surgery. Men may shave face and neck.  Do not bring valuables to the hospital.    [facility] not responsible [facility] any belongings or valuables.               Contacts, dentures or bridgework may not be worn into surgery.  Leave your suitcase in the car. After surgery it may be brought to your room.  For patients admitted to the hospital, discharge time is determined by your treatment team.   Patients discharged the day of surgery will not be allowed to drive home.  You will need someone to drive you home and stay with you the night of your procedure.    Please read over the following fact sheets that you were given:   [facility] Preparing for Surgery and or MRSA Information   _x___ Take these medicines the morning of surgery with A SIP OF WATER:    1. omeprazole (PRILOSEC  2.methyldopa (ALDOMET  3.albuterol (PROAIR  HFA  4.  5.  6.  ____Fleets enema or Magnesium Citrate as directed.   _x___ Use CHG Soap or sage wipes as directed on instruction sheet   ____ Use inhalers on the day of surgery and bring to hospital day of surgery  ____ Stop metformin 2 days prior to surgery    ____ Take 1/2 of usual insulin dose the night before surgery and none on the morning of           surgery.   __x__ Stop Aspirin, Coumadin, Pllavix ,Eliquis, Effient, or Pradaxa  Stop Aspirin 1 week before surgery.  x__ Stop Anti-inflammatories such as Advil, Aleve, Ibuprofen, Motrin, Naproxen,          Naprosyn, Goodies powders or aspirin products. Ok to take Tylenol.   ____ Stop supplements until after surgery.    ____ Bring C-Pap to the hospital.

## 2016-11-21 NOTE — Pre-Procedure Instructions (Signed)
Vital Sign Reading Time Taken  Blood Pressure 160/80 10/04/2016 10:44 AM EST  Pulse 71 10/04/2016 10:44 AM EST  Temperature 36.7 C (98.1 F) 10/04/2016 10:44 AM EST  Respiratory Rate - -  Oxygen Saturation 96% 10/04/2016 10:44 AM EST  Inhaled Oxygen Concentration - -  Weight 99.8 kg (220 lb) 10/04/2016 10:44 AM EST  Height - -  Body Mass Index 38.97 10/04/2016 10:44 AM EST   Ordered Prescriptions - in this encounter  Prescription Sig. Disp. Refills Start Date End Date  fluticasone-salmeterol (ADVAIR HFA) 115-21 mcg/actuation inhaler  Inhale 2 inhalations into the lungs every 12 (twelve) hours. 1 Inhaler  11 10/04/2016    Progress Notes - in this encounter  Mertie Moores, MD - 10/04/2016 10:30 AM EST Formatting of this note may be different from the original.  Follow-up  History of Present Illness: TENISE STETLER is a 76 y.o. female presents to clinic for recheck. Asthma is stable. Rhinitis mildly symptomatic. Struggling to loose weight. No wheezing, coughing, edema, calf pain, syncope or fever.   Current Medications:  Current Outpatient Prescriptions  Medication Sig Dispense Refill  . albuterol 90 mcg/actuation inhaler Inhale 2 inhalations into the lungs every 6 (six) hours as needed for Wheezing. 3 Inhaler 3  . aspirin 81 MG EC tablet Take 81 mg by mouth once daily.  . calcium carbonate-vitamin D3 (CALTRATE-600 + D VIT D3, 800,) 600 mg(1,500mg ) -800 unit Tab Take 1 tablet by mouth once daily.  . cetirizine (ZYRTEC) 10 MG tablet Take 1 tablet (10 mg total) by mouth once daily. 90 tablet 3  . clindamycin (CLEOCIN) 150 MG capsule once daily as needed (for teeth cleaning).   . DOCOSAHEXANOIC ACID/EPA (FISH OIL ORAL) Take by mouth once daily.  . fluticasone (FLONASE) 50 mcg/actuation nasal spray Place 2 sprays into both nostrils once daily. 48 g 3  . fluticasone-salmeterol (ADVAIR HFA) 115-21 mcg/actuation inhaler Inhale 2 inhalations into the lungs every 12 (twelve)  hours.  . folic acid (FOLVITE) 1 MG tablet Take 1 tablet (1 mg total) by mouth once daily. 30 tablet 6  . hyoscyamine (ANASPAZ,LEVSIN) 0.125 mg tablet Take 0.125 mg by mouth every 4 (four) hours as needed for Cramping.  . INFLIXIMAB (REMICADE IV) Inject into the vein.  Marland Kitchen MAGNESIUM ORAL Take by mouth once daily.  . methotrexate (RHEUMATREX) 2.5 MG tablet Take 7 tablets (17.5 mg total) by mouth every 7 (seven) days. 28 tablet 6  . methyldopa (ALDOMET) 500 MG tablet Take 1 tablet (500 mg total) by mouth 2 (two) times daily. 180 tablet 3  . montelukast (SINGULAIR) 10 mg tablet Take 1 tablet (10 mg total) by mouth once daily. 90 tablet 3  . MULTIVITAMIN ORAL Take by mouth once daily.  Marland Kitchen omeprazole (PRILOSEC) 40 MG DR capsule Take 1 capsule (40 mg total) by mouth once daily. 90 capsule 3  . oxybutynin (DITROPAN) 5 mg tablet Take 5 mg by mouth nightly.  . potassium chloride (KLOR-CON) 10 MEQ ER tablet Take 10 mEq by mouth once daily.   . ranitidine (ZANTAC) 300 MG tablet Take 1 tablet (300 mg total) by mouth nightly. 30 tablet 5  . triamterene-hydrochlorothiazide (MAXZIDE-25) 37.5-25 mg tablet Take 1 tablet by mouth once daily. 90 tablet 3   No current facility-administered medications for this visit.   Problem List:  Patient Active Problem List  Diagnosis  . Hypertension  . Perennial allergic rhinitis  . Asthma without status asthmaticus, unspecified  . GERD (gastroesophageal reflux disease)  .  B12 deficiency  . Impaired fasting glucose  . Hyperlipidemia, unspecified  . Psoriatic arthritis/GWK  . Hyperglycemia, unspecified  . Edema  . Osteoarthritis/GWK  . Encounter for long-term (current) use of other medications  . Primary osteoarthritis of both knees  . Right foot pain  . Severe obesity (BMI 35.0-35.9 with comorbidity) (CMS-HCC)  . Lichen sclerosus et atrophicus   History: Past Medical History:  Diagnosis Date  . Asthma without status asthmaticus, unspecified  . B12 deficiency    . COPD (chronic obstructive pulmonary disease) , unspecified (CMS-HCC)  . GERD (gastroesophageal reflux disease)  . Hyperlipidemia, unspecified  mild  . Hypertension  Dx in late 1980"s  . Impaired fasting glucose 06/2013  . Osteoarthritis  . Perennial allergic rhinitis  . Psoriatic arthritis (CMS-HCC)   Past Surgical History:  Procedure Laterality Date  . COLONOSCOPY 08/20/2007  FH colon polyps (son)  . COLONOSCOPY 01/19/2013  FH colon polyps (son)  . EGD 04/29/2009  01/23/1999. No repeat (RTE)  . HYSTERECTOMY  . KNEE ARTHROSCOPY Left  . Right medial unicompartmental knee arthroplasty 07/29/2013  Dr. Erin SonsHarold [facility]  . TONSILLECTOMY  . VEIN SURGERY Bilateral   Family History  Problem Relation Age of Onset  . Diabetes type II Mother  . Coronary artery disease Mother  . Heart attack Mother  . Lung disease Sister  . Diabetes type II Brother  . Lupus Brother  . Diabetes type II Brother  . Colon polyps Son  . Allergies Son  . Allergies Son  . Asthma Daughter   Social History   Social History  . Marital status: Married  Spouse name: Simonne ComeLeo  . Number of children: 3  . Years of education: 12   Occupational History  . Retired   Social History Main Topics  . Smoking status: Never Smoker  . Smokeless tobacco: Never Used  . Alcohol use No  . Drug use: No  . Sexual activity: Defer   Other Topics Concern  . None   Social History Narrative  . None   Allergies:  Synvisc [hylan g-f 20] and Penicillins  Review of Systems: As per above. Pretty much unchanged. No new cardiopulmonary, GI, GU, dermatological symptoms today. No focal neurological symptoms or psychological changes.   Physical Exam: BP 160/80  Pulse 71  Temp 36.7 C (98.1 F) (Oral)  Wt 99.8 kg (220 lb)  SpO2 96%  BMI 38.97 kg/m 99.8 kg (220 lb) 96% General: NAD. Able to speak in complete sentences without cough or dyspnea HEENT: Normocephalic, nontraumatic. Extraocular movements intact NECK:  Supple. No JVD, nodes, thyromegaly CV: RRR no murmurs, gallops, rubs PULM: Normal respiratory effort, Clear to auscultation bilaterally without wheezing or crackles Abd: Benign EXTREMITIES: No significant edema, cyanosis or Homans' signs SKIN: Fair turgor. No rashes LYMPHATIC: No nodes NEURO: No gross deficits,  PSYCH: Appropriate affect, alert, oriented   Impression: 1. Asthma. Mild persistent. Today no on going bronchospasm. Overall doing well 2. Obesity, struggling to loose weight 3. Rhinitis, stable 4. Cleared pulmonary wise for the left knee replacement  Plan: -continue same asthma meds as listed above, refilled her advair -continue to try to loose weight -claritin or zytec prn -follow up in 7 months       Plan of Treatment - as of this encounter   Upcoming Encounters Upcoming Encounters  Date Type Specialty Care Team Description  11/27/2016 Office Visit Orthopaedics Hooten, Adelina MingsJames Philmon Jr., MD  [address] [facility] [address]  [facility] [place]  , [place] [id number]  [phone]  [phone] (Fax)    Latanya Maudlin, PA  [address] [place], [place] [address]  [phone]  [phone] (Fax)    12/05/2016 OnBase Orders Only     12/18/2016 Infusion Rheumatology    12/20/2016 Post Op Orthopaedics Latanya Maudlin, PA  [address] [facility] [place], [place] [address]  [phone]  [phone] (Fax)    12/20/2016 PT/OT Office Visit Physical Therapy Selinda Michaels, PT  [address]  [place], [place] [address]  [phone]  [phone] (Fax)    01/17/2017 Post Op Orthopaedics Hooten, Adelina Mings., MD  [address] [facility] [address]  [facility] [place], [place] [address]  [phone]  [phone] ([phone]    02/06/2017 Office Visit Pulmonology Mertie Moores, MD  [phone] [facility] [address]  [facility] - PULMONOLOGY  [place],  [place] [address]  [phone]  [phone] (Fax)    04/02/2017 Ancillary Orders Lab Allyn Kenner, MD  [address]  [facility]  [place], [place] [address]  [phone]  [phone] (Fax)    04/09/2017 Office Visit Internal Medicine Allyn Kenner, MD  [address]  [facility]  [place], [place] [address]  [phone]  [phone] (Fax)

## 2016-11-22 LAB — URINE CULTURE
Culture: <10000 — AB
Special Requests: NORMAL

## 2016-11-22 NOTE — Pre-Procedure Instructions (Signed)
PTT and C-reactive protein results sent to Dr. Ernest PineHooten for review.

## 2016-12-05 ENCOUNTER — Inpatient Hospital Stay: Payer: Medicare Other | Admitting: Anesthesiology

## 2016-12-05 ENCOUNTER — Inpatient Hospital Stay
Admission: RE | Admit: 2016-12-05 | Discharge: 2016-12-08 | DRG: 470 | Disposition: A | Discharge status: Home / Self Care | Payer: Medicare Other | Source: Ambulatory Visit | Attending: Orthopedic Surgery | Admitting: Orthopedic Surgery

## 2016-12-05 ENCOUNTER — Inpatient Hospital Stay: Payer: Medicare Other

## 2016-12-05 ENCOUNTER — Encounter
Admission: RE | Disposition: A | Discharge status: Home / Self Care | Payer: Self-pay | Source: Ambulatory Visit | Attending: Orthopedic Surgery

## 2016-12-05 ENCOUNTER — Encounter: Payer: Self-pay | Admitting: *Deleted

## 2016-12-05 DIAGNOSIS — Z888 Allergy status to other drugs, medicaments and biological substances status: Secondary | ICD-10-CM | POA: Diagnosis not present

## 2016-12-05 DIAGNOSIS — Z6841 Body Mass Index (BMI) 40.0 and over, adult: Secondary | ICD-10-CM | POA: Diagnosis not present

## 2016-12-05 DIAGNOSIS — J302 Other seasonal allergic rhinitis: Secondary | ICD-10-CM | POA: Diagnosis present

## 2016-12-05 DIAGNOSIS — Z79899 Other long term (current) drug therapy: Secondary | ICD-10-CM

## 2016-12-05 DIAGNOSIS — E538 Deficiency of other specified B group vitamins: Secondary | ICD-10-CM | POA: Diagnosis present

## 2016-12-05 DIAGNOSIS — K219 Gastro-esophageal reflux disease without esophagitis: Secondary | ICD-10-CM | POA: Diagnosis present

## 2016-12-05 DIAGNOSIS — Z880 Allergy status to penicillin: Secondary | ICD-10-CM

## 2016-12-05 DIAGNOSIS — R600 Localized edema: Secondary | ICD-10-CM | POA: Diagnosis present

## 2016-12-05 DIAGNOSIS — M1712 Unilateral primary osteoarthritis, left knee: Secondary | ICD-10-CM | POA: Diagnosis present

## 2016-12-05 DIAGNOSIS — M6281 Muscle weakness (generalized): Secondary | ICD-10-CM

## 2016-12-05 DIAGNOSIS — J449 Chronic obstructive pulmonary disease, unspecified: Secondary | ICD-10-CM | POA: Diagnosis present

## 2016-12-05 DIAGNOSIS — Z96659 Presence of unspecified artificial knee joint: Secondary | ICD-10-CM

## 2016-12-05 DIAGNOSIS — L4050 Arthropathic psoriasis, unspecified: Secondary | ICD-10-CM | POA: Diagnosis present

## 2016-12-05 DIAGNOSIS — I10 Essential (primary) hypertension: Secondary | ICD-10-CM | POA: Diagnosis present

## 2016-12-05 DIAGNOSIS — N3281 Overactive bladder: Secondary | ICD-10-CM | POA: Diagnosis present

## 2016-12-05 DIAGNOSIS — D62 Acute posthemorrhagic anemia: Secondary | ICD-10-CM | POA: Diagnosis not present

## 2016-12-05 DIAGNOSIS — R2681 Unsteadiness on feet: Secondary | ICD-10-CM

## 2016-12-05 DIAGNOSIS — E785 Hyperlipidemia, unspecified: Secondary | ICD-10-CM | POA: Diagnosis present

## 2016-12-05 HISTORY — PX: KNEE ARTHROPLASTY: SHX992

## 2016-12-05 LAB — ABO/RH: ABO/RH(D): O POS

## 2016-12-05 SURGERY — ARTHROPLASTY, KNEE, TOTAL, USING IMAGELESS COMPUTER-ASSISTED NAVIGATION
Anesthesia: Spinal | Laterality: Left | Wound class: Clean

## 2016-12-05 MED ORDER — DEXAMETHASONE SODIUM PHOSPHATE 4 MG/ML IJ SOLN
INTRAMUSCULAR | Status: DC | PRN
  Administered 2016-12-05: 10 mg via INTRAVENOUS

## 2016-12-05 MED ORDER — METHYLDOPA 250 MG PO TABS
500.0000 mg | ORAL_TABLET | Freq: Two times a day (BID) | ORAL | Status: DC
  Administered 2016-12-05 – 2016-12-08 (×5): 500 mg via ORAL
  Filled 2016-12-05: qty 2
  Filled 2016-12-05: qty 1
  Filled 2016-12-05: qty 2
  Filled 2016-12-05 (×2): qty 1
  Filled 2016-12-05 (×2): qty 2

## 2016-12-05 MED ORDER — FENTANYL CITRATE (PF) 250 MCG/5ML IJ SOLN
INTRAMUSCULAR | Status: AC
  Filled 2016-12-05: qty 5

## 2016-12-05 MED ORDER — SODIUM CHLORIDE FLUSH 0.9 % IV SOLN
INTRAVENOUS | Status: AC
  Filled 2016-12-05: qty 10

## 2016-12-05 MED ORDER — SODIUM CHLORIDE 0.9 % IJ SOLN
INTRAMUSCULAR | Status: AC
  Filled 2016-12-05: qty 50

## 2016-12-05 MED ORDER — ROCURONIUM BROMIDE 100 MG/10ML IV SOLN
INTRAVENOUS | Status: DC | PRN
  Administered 2016-12-05: 50 mg via INTRAVENOUS

## 2016-12-05 MED ORDER — MENTHOL 3 MG MT LOZG
1.0000 | LOZENGE | OROMUCOSAL | Status: DC | PRN
  Filled 2016-12-05: qty 9

## 2016-12-05 MED ORDER — VITAMIN E 180 MG (400 UNIT) PO CAPS
400.0000 [IU] | ORAL_CAPSULE | Freq: Every day | ORAL | Status: DC
  Administered 2016-12-05 – 2016-12-08 (×4): 400 [IU] via ORAL
  Filled 2016-12-05 (×4): qty 1

## 2016-12-05 MED ORDER — PROPOFOL 500 MG/50ML IV EMUL
INTRAVENOUS | Status: DC | PRN
  Administered 2016-12-05: 25 ug/kg/min via INTRAVENOUS

## 2016-12-05 MED ORDER — SENNOSIDES-DOCUSATE SODIUM 8.6-50 MG PO TABS
1.0000 | ORAL_TABLET | Freq: Two times a day (BID) | ORAL | Status: DC
  Administered 2016-12-05 – 2016-12-08 (×6): 1 via ORAL
  Filled 2016-12-05 (×6): qty 1

## 2016-12-05 MED ORDER — FENTANYL CITRATE (PF) 100 MCG/2ML IJ SOLN
INTRAMUSCULAR | Status: AC
  Filled 2016-12-05: qty 2

## 2016-12-05 MED ORDER — ALBUTEROL SULFATE HFA 108 (90 BASE) MCG/ACT IN AERS
INHALATION_SPRAY | RESPIRATORY_TRACT | Status: AC
  Filled 2016-12-05: qty 6.7

## 2016-12-05 MED ORDER — ONDANSETRON HCL 4 MG/2ML IJ SOLN
INTRAMUSCULAR | Status: DC | PRN
  Administered 2016-12-05: 4 mg via INTRAVENOUS

## 2016-12-05 MED ORDER — ONDANSETRON HCL 4 MG/2ML IJ SOLN: 4.0000 mg | Freq: Once | INTRAMUSCULAR | Status: DC | PRN

## 2016-12-05 MED ORDER — BUPIVACAINE LIPOSOME 1.3 % IJ SUSP
INTRAMUSCULAR | Status: AC
  Filled 2016-12-05: qty 20

## 2016-12-05 MED ORDER — BISACODYL 10 MG RE SUPP
10.0000 mg | Freq: Every day | RECTAL | Status: DC | PRN
  Administered 2016-12-08: 10 mg via RECTAL
  Filled 2016-12-05: qty 1

## 2016-12-05 MED ORDER — GLYCOPYRROLATE 0.2 MG/ML IJ SOLN
INTRAMUSCULAR | Status: AC
  Filled 2016-12-05: qty 1

## 2016-12-05 MED ORDER — TRIAMTERENE-HCTZ 37.5-25 MG PO TABS
1.0000 | ORAL_TABLET | Freq: Every day | ORAL | Status: DC
  Administered 2016-12-05 – 2016-12-08 (×4): 1 via ORAL
  Filled 2016-12-05 (×4): qty 1

## 2016-12-05 MED ORDER — FLEET ENEMA 7-19 GM/118ML RE ENEM: 1.0000 | ENEMA | Freq: Once | RECTAL | Status: DC | PRN

## 2016-12-05 MED ORDER — ACETAMINOPHEN 10 MG/ML IV SOLN
1000.0000 mg | Freq: Four times a day (QID) | INTRAVENOUS | Status: AC
  Administered 2016-12-05 – 2016-12-06 (×3): 1000 mg via INTRAVENOUS
  Filled 2016-12-05 (×4): qty 100

## 2016-12-05 MED ORDER — ONDANSETRON HCL 4 MG/2ML IJ SOLN: 4.0000 mg | Freq: Four times a day (QID) | INTRAMUSCULAR | Status: DC | PRN

## 2016-12-05 MED ORDER — ACETAMINOPHEN 10 MG/ML IV SOLN
INTRAVENOUS | Status: DC | PRN
  Administered 2016-12-05: 1000 mg via INTRAVENOUS

## 2016-12-05 MED ORDER — POTASSIUM GLUCONATE 595 (99 K) MG PO TABS: 595.0000 mg | ORAL_TABLET | Freq: Every day | ORAL | Status: DC

## 2016-12-05 MED ORDER — ACETAMINOPHEN 325 MG PO TABS: 650.0000 mg | ORAL_TABLET | Freq: Four times a day (QID) | ORAL | Status: DC | PRN

## 2016-12-05 MED ORDER — MAGNESIUM HYDROXIDE 400 MG/5ML PO SUSP
30.0000 mL | Freq: Every day | ORAL | Status: DC | PRN
  Administered 2016-12-07: 30 mL via ORAL
  Filled 2016-12-05: qty 30

## 2016-12-05 MED ORDER — MIDAZOLAM HCL 5 MG/5ML IJ SOLN
INTRAMUSCULAR | Status: DC | PRN
  Administered 2016-12-05 (×2): 1 mg via INTRAVENOUS

## 2016-12-05 MED ORDER — LACTATED RINGERS IV SOLN
INTRAVENOUS | Status: DC
  Administered 2016-12-05 (×2): via INTRAVENOUS

## 2016-12-05 MED ORDER — FLUTICASONE PROPIONATE 50 MCG/ACT NA SUSP
2.0000 | Freq: Every day | NASAL | Status: DC | PRN
  Filled 2016-12-05: qty 16

## 2016-12-05 MED ORDER — MORPHINE SULFATE (PF) 2 MG/ML IV SOLN
2.0000 mg | INTRAVENOUS | Status: DC | PRN
Start: 2016-12-05 — End: 2016-12-08

## 2016-12-05 MED ORDER — MONTELUKAST SODIUM 10 MG PO TABS
10.0000 mg | ORAL_TABLET | Freq: Every day | ORAL | Status: DC
  Administered 2016-12-05 – 2016-12-07 (×3): 10 mg via ORAL
  Filled 2016-12-05 (×3): qty 1

## 2016-12-05 MED ORDER — CELECOXIB 200 MG PO CAPS
200.0000 mg | ORAL_CAPSULE | Freq: Two times a day (BID) | ORAL | Status: DC
  Administered 2016-12-05 – 2016-12-08 (×6): 200 mg via ORAL
  Filled 2016-12-05 (×6): qty 1

## 2016-12-05 MED ORDER — RISAQUAD PO CAPS
1.0000 | ORAL_CAPSULE | Freq: Every day | ORAL | Status: DC
  Administered 2016-12-06 – 2016-12-08 (×3): 1 via ORAL
  Filled 2016-12-05 (×3): qty 1

## 2016-12-05 MED ORDER — PROPOFOL 10 MG/ML IV BOLUS
INTRAVENOUS | Status: DC | PRN
  Administered 2016-12-05: 120 mg via INTRAVENOUS
  Administered 2016-12-05: 8 mg via INTRAVENOUS
  Administered 2016-12-05: 30 mg via INTRAVENOUS
  Administered 2016-12-05: 100 mg via INTRAVENOUS

## 2016-12-05 MED ORDER — BUPIVACAINE IN DEXTROSE 0.75-8.25 % IT SOLN: INTRATHECAL | Status: DC | PRN

## 2016-12-05 MED ORDER — OXYBUTYNIN CHLORIDE ER 5 MG PO TB24
5.0000 mg | ORAL_TABLET | Freq: Every day | ORAL | Status: DC
  Administered 2016-12-05 – 2016-12-07 (×3): 5 mg via ORAL
  Filled 2016-12-05 (×3): qty 1

## 2016-12-05 MED ORDER — TRANEXAMIC ACID 1000 MG/10ML IV SOLN
INTRAVENOUS | Status: DC | PRN
  Administered 2016-12-05: 1000 mg via INTRAVENOUS

## 2016-12-05 MED ORDER — CLINDAMYCIN PHOSPHATE 900 MG/50ML IV SOLN
900.0000 mg | INTRAVENOUS | Status: AC
  Administered 2016-12-05: 900 mg via INTRAVENOUS

## 2016-12-05 MED ORDER — CLINDAMYCIN PHOSPHATE 600 MG/50ML IV SOLN
600.0000 mg | Freq: Four times a day (QID) | INTRAVENOUS | Status: AC
  Administered 2016-12-05 – 2016-12-06 (×4): 600 mg via INTRAVENOUS
  Filled 2016-12-05 (×4): qty 50

## 2016-12-05 MED ORDER — ALBUTEROL SULFATE (2.5 MG/3ML) 0.083% IN NEBU: 2.5000 mg | INHALATION_SOLUTION | RESPIRATORY_TRACT | Status: DC | PRN

## 2016-12-05 MED ORDER — NEOMYCIN-POLYMYXIN B GU 40-200000 IR SOLN
Status: AC
  Filled 2016-12-05: qty 20

## 2016-12-05 MED ORDER — VITAMIN D 1000 UNITS PO TABS
1000.0000 [IU] | ORAL_TABLET | Freq: Every day | ORAL | Status: DC
  Administered 2016-12-06 – 2016-12-08 (×2): 1000 [IU] via ORAL
  Filled 2016-12-05 (×3): qty 1

## 2016-12-05 MED ORDER — CHLORHEXIDINE GLUCONATE 4 % EX LIQD: 60.0000 mL | Freq: Once | CUTANEOUS | Status: DC

## 2016-12-05 MED ORDER — FENTANYL CITRATE (PF) 100 MCG/2ML IJ SOLN
INTRAMUSCULAR | Status: AC
  Administered 2016-12-05: 25 ug via INTRAVENOUS
  Filled 2016-12-05: qty 2

## 2016-12-05 MED ORDER — FENTANYL CITRATE (PF) 100 MCG/2ML IJ SOLN
INTRAMUSCULAR | Status: DC | PRN
  Administered 2016-12-05: 100 ug via INTRAVENOUS
  Administered 2016-12-05: 300 ug via INTRAVENOUS
  Administered 2016-12-05: 50 ug via INTRAVENOUS

## 2016-12-05 MED ORDER — BUPIVACAINE HCL (PF) 0.25 % IJ SOLN
INTRAMUSCULAR | Status: AC
  Filled 2016-12-05: qty 60

## 2016-12-05 MED ORDER — CLINDAMYCIN PHOSPHATE 900 MG/50ML IV SOLN
INTRAVENOUS | Status: AC
  Filled 2016-12-05: qty 50

## 2016-12-05 MED ORDER — SODIUM CHLORIDE 0.9 % IV SOLN
INTRAVENOUS | Status: DC | PRN
  Administered 2016-12-05: 60 mL

## 2016-12-05 MED ORDER — MOMETASONE FURO-FORMOTEROL FUM 200-5 MCG/ACT IN AERO
2.0000 | INHALATION_SPRAY | Freq: Two times a day (BID) | RESPIRATORY_TRACT | Status: DC
  Administered 2016-12-05 – 2016-12-08 (×5): 2 via RESPIRATORY_TRACT
  Filled 2016-12-05: qty 8.8

## 2016-12-05 MED ORDER — HYOSCYAMINE SULFATE 0.125 MG SL SUBL
0.1250 mg | SUBLINGUAL_TABLET | SUBLINGUAL | Status: DC | PRN
  Administered 2016-12-05: 0.125 mg via SUBLINGUAL
  Filled 2016-12-05 (×2): qty 1

## 2016-12-05 MED ORDER — ALBUTEROL SULFATE HFA 108 (90 BASE) MCG/ACT IN AERS
INHALATION_SPRAY | RESPIRATORY_TRACT | Status: DC | PRN
  Administered 2016-12-05: 4 via RESPIRATORY_TRACT

## 2016-12-05 MED ORDER — PHENOL 1.4 % MT LIQD
1.0000 | OROMUCOSAL | Status: DC | PRN
  Filled 2016-12-05: qty 177

## 2016-12-05 MED ORDER — ONDANSETRON HCL 4 MG PO TABS
4.0000 mg | ORAL_TABLET | Freq: Four times a day (QID) | ORAL | Status: DC | PRN
  Administered 2016-12-08: 4 mg via ORAL
  Filled 2016-12-05: qty 1

## 2016-12-05 MED ORDER — TRAMADOL HCL 50 MG PO TABS
50.0000 mg | ORAL_TABLET | ORAL | Status: DC | PRN
  Administered 2016-12-07: 100 mg via ORAL
  Administered 2016-12-07: 50 mg via ORAL
  Filled 2016-12-05: qty 2
  Filled 2016-12-05: qty 1

## 2016-12-05 MED ORDER — FENTANYL CITRATE (PF) 100 MCG/2ML IJ SOLN
25.0000 ug | INTRAMUSCULAR | Status: DC | PRN
  Administered 2016-12-05 (×3): 25 ug via INTRAVENOUS

## 2016-12-05 MED ORDER — PROPOFOL 500 MG/50ML IV EMUL
INTRAVENOUS | Status: AC
  Filled 2016-12-05: qty 50

## 2016-12-05 MED ORDER — HYDRALAZINE HCL 20 MG/ML IJ SOLN
INTRAMUSCULAR | Status: AC
Start: 2016-12-05 — End: 2016-12-05
  Filled 2016-12-05: qty 1

## 2016-12-05 MED ORDER — NEOMYCIN-POLYMYXIN B GU 40-200000 IR SOLN
Status: DC | PRN
  Administered 2016-12-05: 14 mL

## 2016-12-05 MED ORDER — MAGNESIUM OXIDE 400 (241.3 MG) MG PO TABS
400.0000 mg | ORAL_TABLET | Freq: Every day | ORAL | Status: DC
  Administered 2016-12-06 – 2016-12-08 (×3): 400 mg via ORAL
  Filled 2016-12-05 (×3): qty 1

## 2016-12-05 MED ORDER — BUPIVACAINE HCL (PF) 0.75 % IJ SOLN
INTRAMUSCULAR | Status: DC | PRN
  Administered 2016-12-05: 1.6 mL

## 2016-12-05 MED ORDER — ACETAMINOPHEN 10 MG/ML IV SOLN
INTRAVENOUS | Status: AC
  Filled 2016-12-05: qty 100

## 2016-12-05 MED ORDER — ESTRADIOL 0.1 MG/GM VA CREA
0.5000 | TOPICAL_CREAM | Freq: Every day | VAGINAL | Status: DC
  Filled 2016-12-05: qty 42.5

## 2016-12-05 MED ORDER — LIDOCAINE HCL (PF) 2 % IJ SOLN
INTRAMUSCULAR | Status: AC
  Filled 2016-12-05: qty 2

## 2016-12-05 MED ORDER — BUPIVACAINE HCL (PF) 0.25 % IJ SOLN
INTRAMUSCULAR | Status: DC | PRN
  Administered 2016-12-05: 60 mL

## 2016-12-05 MED ORDER — LORATADINE 10 MG PO TABS
10.0000 mg | ORAL_TABLET | Freq: Every day | ORAL | Status: DC
  Administered 2016-12-05 – 2016-12-08 (×4): 10 mg via ORAL
  Filled 2016-12-05 (×4): qty 1

## 2016-12-05 MED ORDER — ACETAMINOPHEN 650 MG RE SUPP: 650.0000 mg | Freq: Four times a day (QID) | RECTAL | Status: DC | PRN

## 2016-12-05 MED ORDER — OXYCODONE HCL 5 MG PO TABS
5.0000 mg | ORAL_TABLET | ORAL | Status: DC | PRN
  Administered 2016-12-05: 10 mg via ORAL
  Administered 2016-12-06 – 2016-12-07 (×5): 5 mg via ORAL
  Administered 2016-12-07: 10 mg via ORAL
  Administered 2016-12-07 – 2016-12-08 (×2): 5 mg via ORAL
  Administered 2016-12-08: 10 mg via ORAL
  Filled 2016-12-05: qty 2
  Filled 2016-12-05 (×6): qty 1
  Filled 2016-12-05: qty 2
  Filled 2016-12-05 (×2): qty 1

## 2016-12-05 MED ORDER — MIDAZOLAM HCL 2 MG/2ML IJ SOLN
INTRAMUSCULAR | Status: AC
  Filled 2016-12-05: qty 2

## 2016-12-05 MED ORDER — LIDOCAINE HCL (CARDIAC) 20 MG/ML IV SOLN
INTRAVENOUS | Status: DC | PRN
  Administered 2016-12-05: 100 mg via INTRAVENOUS

## 2016-12-05 MED ORDER — DEXAMETHASONE SODIUM PHOSPHATE 10 MG/ML IJ SOLN
INTRAMUSCULAR | Status: AC
  Filled 2016-12-05: qty 1

## 2016-12-05 MED ORDER — DIPHENHYDRAMINE HCL 12.5 MG/5ML PO ELIX: 12.5000 mg | ORAL_SOLUTION | ORAL | Status: DC | PRN

## 2016-12-05 MED ORDER — HYDRALAZINE HCL 20 MG/ML IJ SOLN
INTRAMUSCULAR | Status: DC | PRN
  Administered 2016-12-05: 10 mg via INTRAVENOUS

## 2016-12-05 MED ORDER — ADULT MULTIVITAMIN W/MINERALS CH
1.0000 | ORAL_TABLET | Freq: Every day | ORAL | Status: DC
Start: 2016-12-05 — End: 2016-12-08
  Administered 2016-12-05 – 2016-12-08 (×4): 1 via ORAL
  Filled 2016-12-05 (×4): qty 1

## 2016-12-05 MED ORDER — CALCIUM CARBONATE ANTACID 500 MG PO CHEW
500.0000 mg | CHEWABLE_TABLET | Freq: Every day | ORAL | Status: DC
  Administered 2016-12-08: 200 mg via ORAL
  Filled 2016-12-05: qty 1

## 2016-12-05 MED ORDER — VITAMIN B-12 1000 MCG PO TABS
1000.0000 ug | ORAL_TABLET | Freq: Every day | ORAL | Status: DC
Start: 2016-12-05 — End: 2016-12-08
  Administered 2016-12-05 – 2016-12-08 (×4): 1000 ug via ORAL
  Filled 2016-12-05 (×4): qty 1

## 2016-12-05 MED ORDER — SUCCINYLCHOLINE CHLORIDE 200 MG/10ML IV SOSY
PREFILLED_SYRINGE | INTRAVENOUS | Status: AC
  Filled 2016-12-05: qty 10

## 2016-12-05 MED ORDER — TRANEXAMIC ACID 1000 MG/10ML IV SOLN
1000.0000 mg | Freq: Once | INTRAVENOUS | Status: AC
  Administered 2016-12-05: 1000 mg via INTRAVENOUS
  Filled 2016-12-05: qty 10

## 2016-12-05 MED ORDER — TETRACAINE HCL 1 % IJ SOLN
INTRAMUSCULAR | Status: DC | PRN
  Administered 2016-12-05: 5 mg via INTRASPINAL

## 2016-12-05 MED ORDER — CLOBETASOL PROPIONATE 0.05 % EX OINT
1.0000 "application " | TOPICAL_OINTMENT | Freq: Every day | CUTANEOUS | Status: DC
  Filled 2016-12-05: qty 15

## 2016-12-05 MED ORDER — POTASSIUM CHLORIDE ER 10 MEQ PO TBCR
10.0000 meq | EXTENDED_RELEASE_TABLET | Freq: Every day | ORAL | Status: DC
  Administered 2016-12-05 – 2016-12-08 (×4): 10 meq via ORAL
  Filled 2016-12-05 (×8): qty 1

## 2016-12-05 MED ORDER — ENOXAPARIN SODIUM 40 MG/0.4ML ~~LOC~~ SOLN
40.0000 mg | Freq: Two times a day (BID) | SUBCUTANEOUS | Status: DC
  Filled 2016-12-05: qty 0.4

## 2016-12-05 MED ORDER — SODIUM CHLORIDE 0.9 % IV SOLN
INTRAVENOUS | Status: DC
  Administered 2016-12-05 (×2): via INTRAVENOUS

## 2016-12-05 MED ORDER — FERROUS SULFATE 325 (65 FE) MG PO TABS
325.0000 mg | ORAL_TABLET | Freq: Two times a day (BID) | ORAL | Status: DC
  Administered 2016-12-06 – 2016-12-08 (×5): 325 mg via ORAL
  Filled 2016-12-05 (×5): qty 1

## 2016-12-05 MED ORDER — ALUM & MAG HYDROXIDE-SIMETH 200-200-20 MG/5ML PO SUSP: 30.0000 mL | ORAL | Status: DC | PRN

## 2016-12-05 MED ORDER — TRANEXAMIC ACID 1000 MG/10ML IV SOLN
1000.0000 mg | INTRAVENOUS | Status: AC
  Administered 2016-12-05: 1000 mg via INTRAVENOUS
  Filled 2016-12-05: qty 10

## 2016-12-05 MED ORDER — METOCLOPRAMIDE HCL 10 MG PO TABS
10.0000 mg | ORAL_TABLET | Freq: Three times a day (TID) | ORAL | Status: AC
  Administered 2016-12-05 – 2016-12-07 (×6): 10 mg via ORAL
  Filled 2016-12-05 (×7): qty 1

## 2016-12-05 MED ORDER — FOLIC ACID 1 MG PO TABS
1.0000 mg | ORAL_TABLET | Freq: Every day | ORAL | Status: DC
  Administered 2016-12-05 – 2016-12-08 (×4): 1 mg via ORAL
  Filled 2016-12-05 (×4): qty 1

## 2016-12-05 MED ORDER — SUCCINYLCHOLINE CHLORIDE 20 MG/ML IJ SOLN
INTRAMUSCULAR | Status: DC | PRN
  Administered 2016-12-05: 100 mg via INTRAVENOUS

## 2016-12-05 MED ORDER — PANTOPRAZOLE SODIUM 40 MG PO TBEC
40.0000 mg | DELAYED_RELEASE_TABLET | Freq: Two times a day (BID) | ORAL | Status: DC
  Administered 2016-12-05 – 2016-12-08 (×6): 40 mg via ORAL
  Filled 2016-12-05 (×6): qty 1

## 2016-12-05 SURGICAL SUPPLY — 65 items
AUTOTRANSFUS HAS 1/8 (MISCELLANEOUS) ×3
BATTERY INSTRU NAVIGATION (MISCELLANEOUS) ×12 IMPLANT
BLADE SAW 1 (BLADE) ×6 IMPLANT
BLADE SAW 1/2 (BLADE) ×3 IMPLANT
BLADE SAW 70X12.5 (BLADE) IMPLANT
BONE CEMENT GENTAMICIN (Cement) ×6 IMPLANT
CANISTER SUCT 1200ML W/VALVE (MISCELLANEOUS) ×3 IMPLANT
CANISTER SUCT 3000ML (MISCELLANEOUS) ×6 IMPLANT
CAPT KNEE TOTAL 3 ATTUNE ×3 IMPLANT
CATH TRAY METER 16FR LF (MISCELLANEOUS) ×3 IMPLANT
CEMENT BONE GENTAMICIN 40 (Cement) ×2 IMPLANT
COOLER POLAR GLACIER W/PUMP (MISCELLANEOUS) ×3 IMPLANT
CUFF TOURN 24 STER (MISCELLANEOUS) IMPLANT
CUFF TOURN 30 STER DUAL PORT (MISCELLANEOUS) IMPLANT
CUFF TOURN 34 STER (MISCELLANEOUS) ×3 IMPLANT
DRAPE SHEET LG 3/4 BI-LAMINATE (DRAPES) ×3 IMPLANT
DRSG DERMACEA 8X12 NADH (GAUZE/BANDAGES/DRESSINGS) ×3 IMPLANT
DRSG OPSITE POSTOP 4X14 (GAUZE/BANDAGES/DRESSINGS) ×3 IMPLANT
DRSG TEGADERM 4X4.75 (GAUZE/BANDAGES/DRESSINGS) ×3 IMPLANT
DURAPREP 26ML APPLICATOR (WOUND CARE) ×6 IMPLANT
ELECT CAUTERY BLADE 6.4 (BLADE) ×3 IMPLANT
ELECT REM PT RETURN 9FT ADLT (ELECTROSURGICAL) ×3
ELECTRODE REM PT RTRN 9FT ADLT (ELECTROSURGICAL) ×1 IMPLANT
EX-PIN ORTHOLOCK NAV 4X150 (PIN) ×6 IMPLANT
GLOVE BIOGEL M STRL SZ7.5 (GLOVE) ×6 IMPLANT
GLOVE INDICATOR 8.0 STRL GRN (GLOVE) ×3 IMPLANT
GLOVE SURG 9.0 ORTHO LTXF (GLOVE) ×3 IMPLANT
GLOVE SURG ORTHO 9.0 STRL STRW (GLOVE) ×3 IMPLANT
GOWN STRL REUS W/ TWL LRG LVL3 (GOWN DISPOSABLE) ×2 IMPLANT
GOWN STRL REUS W/TWL 2XL LVL3 (GOWN DISPOSABLE) ×3 IMPLANT
GOWN STRL REUS W/TWL LRG LVL3 (GOWN DISPOSABLE) ×4
HANDPIECE INTERPULSE COAX TIP (DISPOSABLE) ×2
HOLDER FOLEY CATH W/STRAP (MISCELLANEOUS) ×3 IMPLANT
HOOD PEEL AWAY FLYTE STAYCOOL (MISCELLANEOUS) ×6 IMPLANT
KIT RM TURNOVER STRD PROC AR (KITS) ×3 IMPLANT
KNIFE SCULPS 14X20 (INSTRUMENTS) ×3 IMPLANT
LABEL OR SOLS (LABEL) ×3 IMPLANT
NDL SAFETY 18GX1.5 (NEEDLE) ×3 IMPLANT
NEEDLE SPNL 20GX3.5 QUINCKE YW (NEEDLE) ×3 IMPLANT
NS IRRIG 500ML POUR BTL (IV SOLUTION) ×3 IMPLANT
PACK TOTAL KNEE (MISCELLANEOUS) ×3 IMPLANT
PAD WRAPON POLAR KNEE (MISCELLANEOUS) IMPLANT
PAD WRAPON POLAR KNEE LONG (MISCELLANEOUS) ×1 IMPLANT
PIN DRILL QUICK PACK ×3 IMPLANT
PIN FIXATION 1/8DIA X 3INL (PIN) ×3 IMPLANT
SET HNDPC FAN SPRY TIP SCT (DISPOSABLE) ×1 IMPLANT
SOL .9 NS 3000ML IRR  AL (IV SOLUTION) ×2
SOL .9 NS 3000ML IRR UROMATIC (IV SOLUTION) ×1 IMPLANT
SOL PREP PVP 2OZ (MISCELLANEOUS) ×3
SOLUTION PREP PVP 2OZ (MISCELLANEOUS) ×1 IMPLANT
SPONGE DRAIN TRACH 4X4 STRL 2S (GAUZE/BANDAGES/DRESSINGS) ×3 IMPLANT
STAPLER SKIN PROX 35W (STAPLE) ×3 IMPLANT
STOCKINETTE IMPERV 14X48 (MISCELLANEOUS) ×3 IMPLANT
SUCTION FRAZIER HANDLE 10FR (MISCELLANEOUS) ×2
SUCTION TUBE FRAZIER 10FR DISP (MISCELLANEOUS) ×1 IMPLANT
SUT VIC AB 0 CT1 36 (SUTURE) ×3 IMPLANT
SUT VIC AB 1 CT1 36 (SUTURE) ×6 IMPLANT
SUT VIC AB 2-0 CT2 27 (SUTURE) ×3 IMPLANT
SYR 20CC LL (SYRINGE) ×3 IMPLANT
SYR 30ML LL (SYRINGE) ×6 IMPLANT
SYSTEM AUTOTRANSFUS DUAL TROCR (MISCELLANEOUS) ×1 IMPLANT
TOWEL OR 17X26 4PK STRL BLUE (TOWEL DISPOSABLE) ×3 IMPLANT
TOWER CARTRIDGE SMART MIX (DISPOSABLE) ×3 IMPLANT
WRAPON POLAR PAD KNEE (MISCELLANEOUS)
WRAPON POLAR PAD KNEE LONG (MISCELLANEOUS) ×3

## 2016-12-05 NOTE — H&P (Signed)
The patient has been re-examined, and the chart reviewed, and there have been no interval changes to the documented history and physical.    The risks, benefits, and alternatives have been discussed at length. The patient expressed understanding of the risks benefits and agreed with plans for surgical intervention.  James P. Hooten, Jr. M.D.    

## 2016-12-05 NOTE — Brief Op Note (Signed)
12/05/2016  4:57 PM  PATIENT:  Emily Fox  76 y.o. female  PRE-OPERATIVE DIAGNOSIS:  PRIMARY OSTEOARTHRITIS LEFT KNEE  POST-OPERATIVE DIAGNOSIS:  PRIMARY OSTEOARTHRITIS LEFT KNEE  PROCEDURE:  Procedure(s): COMPUTER ASSISTED TOTAL KNEE ARTHROPLASTY (Left)  SURGEON:  Surgeon(s) and Role:    * Donato HeinzJames P Aurilla Coulibaly, MD - Primary  ASSISTANTS: Van ClinesJon Wolfe, PA   ANESTHESIA:   spinal and general  EBL:  Total I/O In: 1950 [I.V.:1950] Out: 75 [Blood:75]  BLOOD ADMINISTERED:none  DRAINS: 2 medium drains to a reinfusion system   LOCAL MEDICATIONS USED:  MARCAINE    and OTHER Exparel  SPECIMEN:  No Specimen  DISPOSITION OF SPECIMEN:  N/A  COUNTS:  YES  TOURNIQUET:    DICTATION: .Dragon Dictation  PLAN OF CARE: Admit to inpatient   PATIENT DISPOSITION:  PACU - hemodynamically stable.   Delay start of Pharmacological VTE agent (>24hrs) due to surgical blood loss or risk of bleeding: yes

## 2016-12-05 NOTE — Anesthesia Procedure Notes (Signed)
Procedure Name: Intubation Performed by: Shirlee LimerickMARION, Syon Tews Pre-anesthesia Checklist: Patient identified, Patient being monitored, Timeout performed, Emergency Drugs available and Suction available Patient Re-evaluated:Patient Re-evaluated prior to inductionOxygen Delivery Method: Circle system utilized Preoxygenation: Pre-oxygenation with 100% oxygen Intubation Type: IV induction and Rapid sequence Ventilation: Mask ventilation without difficulty Laryngoscope Size: Miller and 2 Grade View: Grade I Tube type: Oral Tube size: 7.0 mm Number of attempts: 1 Airway Equipment and Method: Stylet Placement Confirmation: ETT inserted through vocal cords under direct vision,  positive ETCO2 and breath sounds checked- equal and bilateral Secured at: 21 cm Tube secured with: Tape Dental Injury: Teeth and Oropharynx as per pre-operative assessment

## 2016-12-05 NOTE — Anesthesia Preprocedure Evaluation (Addendum)
Anesthesia Evaluation  Patient identified by MRN, date of birth, ID band Patient awake    Reviewed: Allergy & Precautions, NPO status , Patient's Chart, lab work & pertinent test results, reviewed documented beta blocker date and time   Airway Mallampati: III  TM Distance: >3 FB     Dental  (+) Chipped, Missing   Pulmonary asthma , COPD,  COPD inhaler, former smoker,           Cardiovascular hypertension, Pt. on medications      Neuro/Psych  Neuromuscular disease    GI/Hepatic GERD  Controlled,  Endo/Other    Renal/GU      Musculoskeletal  (+) Arthritis ,   Abdominal   Peds  (+) ATTENTION DEFICIT DISORDER WITHOUT HYPERACTIVITY Hematology   Anesthesia Other Findings Hypertensive this am. EKG OK. ptt OK. No cardiac symptoms.  Reproductive/Obstetrics                           Anesthesia Physical Anesthesia Plan  ASA: III  Anesthesia Plan: Spinal   Post-op Pain Management:    Induction:   Airway Management Planned:   Additional Equipment:   Intra-op Plan:   Post-operative Plan:   Informed Consent: I have reviewed the patients History and Physical, chart, labs and discussed the procedure including the risks, benefits and alternatives for the proposed anesthesia with the patient or authorized representative who has indicated his/her understanding and acceptance.     Plan Discussed with: CRNA  Anesthesia Plan Comments:         Anesthesia Quick Evaluation

## 2016-12-05 NOTE — Anesthesia Procedure Notes (Signed)
Spinal  Patient location during procedure: OR Staffing Anesthesiologist: Berdine AddisonHOMAS, Odaliz Mcqueary Performed: anesthesiologist  Preanesthetic Checklist Completed: patient identified, site marked, surgical consent, pre-op evaluation, timeout performed, IV checked and risks and benefits discussed Spinal Block Patient position: sitting Prep: Betadine Patient monitoring: heart rate, cardiac monitor, continuous pulse ox and blood pressure Approach: midline Location: L3-4 Injection technique: single-shot Needle Needle type: Pencil-Tip  Needle gauge: 25 G Needle length: 9 cm Assessment Sensory level: T10 Additional Notes 1302 marcaine 1.326ml and tetracaine 5 mg.

## 2016-12-05 NOTE — Transfer of Care (Signed)
Immediate Anesthesia Transfer of Care Note  Patient: Emily Fox  Procedure(s) Performed: Procedure(s): COMPUTER ASSISTED TOTAL KNEE ARTHROPLASTY (Left)  Patient Location: PACU  Anesthesia Type:General  Level of Consciousness: sedated  Airway & Oxygen Therapy: Patient Spontanous Breathing and Patient connected to nasal cannula oxygen  Post-op Assessment: Report given to RN and Post -op Vital signs reviewed and stable  Post vital signs: Reviewed and stable  Last Vitals:  Vitals:   12/05/16 1019 12/05/16 1656  BP: (!) 154/61 127/66  Pulse: 80 87  Resp: 20 12  Temp: 36.5 C 36.2 C    Last Pain:  Vitals:   12/05/16 1019  TempSrc: Oral  PainSc: 6          Complications: No apparent anesthesia complications

## 2016-12-05 NOTE — Op Note (Signed)
OPERATIVE NOTE  DATE OF SURGERY:  12/05/2016  PATIENT NAME:  Emily Fox   DOB: 04/29/1941  MRN: [id number]  PRE-OPERATIVE DIAGNOSIS: Degenerative arthrosis of the left knee, primary  POST-OPERATIVE DIAGNOSIS:  Same  PROCEDURE:  Left total knee arthroplasty using computer-assisted navigation  SURGEON:  Jena Gauss. M.D.  ASSISTANT:  Van Clines, PA (present and scrubbed throughout the case, critical for assistance with exposure, retraction, instrumentation, and closure)  ANESTHESIA: spinal and general  ESTIMATED BLOOD LOSS: 75 mL  FLUIDS REPLACED: 1950 mL of crystalloid  TOURNIQUET TIME: 110 minutes  DRAINS: 2 medium drains to a reinfusion system  SOFT TISSUE RELEASES: Anterior cruciate ligament, posterior cruciate ligament, deep and superficial medial collateral ligament, patellofemoral ligament  IMPLANTS UTILIZED: DePuy Attune size 5N posterior stabilized femoral component (cemented), size 4 rotating platform tibial component (cemented), 38 mm medialized dome patella (cemented), and a 8 mm stabilized rotating platform polyethylene insert.  INDICATIONS FOR SURGERY: Brenn Gatton is a 76 y.o. year old female with a long history of progressive knee pain. X-rays demonstrated severe degenerative changes in tricompartmental fashion. The patient had not seen any significant improvement despite conservative nonsurgical intervention. After discussion of the risks and benefits of surgical intervention, the patient expressed understanding of the risks benefits and agree with plans for total knee arthroplasty.   The risks, benefits, and alternatives were discussed at length including but not limited to the risks of infection, bleeding, nerve injury, stiffness, blood clots, the need for revision surgery, cardiopulmonary complications, among others, and they were willing to proceed.  PROCEDURE IN DETAIL: The patient was brought into the operating room and, after adequate spinal and  general anesthesia was achieved, a tourniquet was placed on the patient's upper thigh. The patient's knee and leg were cleaned and prepped with alcohol and DuraPrep and draped in the usual sterile fashion. A "timeout" was performed as per usual protocol. The lower extremity was exsanguinated using an Esmarch, and the tourniquet was inflated to 300 mmHg. An anterior longitudinal incision was made followed by a standard mid vastus approach. The deep fibers of the medial collateral ligament were elevated in a subperiosteal fashion off of the medial flare of the tibia so as to maintain a continuous soft tissue sleeve. The patella was subluxed laterally and the patellofemoral ligament was incised. Inspection of the knee demonstrated severe degenerative changes with full-thickness loss of articular cartilage. Osteophytes were debrided using a rongeur. Anterior and posterior cruciate ligaments were excised. Two 4.0 mm Schanz pins were inserted in the femur and into the tibia for attachment of the array of trackers used for computer-assisted navigation. Hip center was identified using a circumduction technique. Distal landmarks were mapped using the computer. The distal femur and proximal tibia were mapped using the computer. The distal femoral cutting guide was positioned using computer-assisted navigation so as to achieve a 5 distal valgus cut. The femur was sized and it was felt that a size 5N femoral component was appropriate. A size 5 femoral cutting guide was positioned and the anterior cut was performed and verified using the computer. This was followed by completion of the posterior and chamfer cuts. Femoral cutting guide for the central box was then positioned in the center box cut was performed.  Attention was then directed to the proximal tibia. Medial and lateral menisci were excised. The extramedullary tibial cutting guide was positioned using computer-assisted navigation so as to achieve a 0 varus-valgus  alignment and 3 posterior slope. The cut was performed and  verified using the computer. The proximal tibia was sized and it was felt that a size 4 tibial tray was appropriate. Tibial and femoral trials were inserted followed by insertion of a 5 mm polyethylene insert. The knee was felt to be tight medially. A Cobb elevator was used to elevate the superficial fibers of the medial collateral ligament. The 5 mm polyethylene insert was replaced by an 8 mm polyethylene insert. This allowed for excellent mediolateral soft tissue balancing both in flexion and in full extension. Finally, the patella was cut and prepared so as to accommodate a 38 mm medialized dome patella. A patella trial was placed and the knee was placed through a range of motion with excellent patellar tracking appreciated. The femoral trial was removed after debridement of posterior osteophytes. The central post-hole for the tibial component was reamed followed by insertion of a keel punch. Tibial trials were then removed. Cut surfaces of bone were irrigated with copious amounts of normal saline with antibiotic solution using pulsatile lavage and then suctioned dry. Polymethylmethacrylate cement with gentamicin was prepared in the usual fashion using a vacuum mixer. Cement was applied to the cut surface of the proximal tibia as well as along the undersurface of a size 4 rotating platform tibial component. Tibial component was positioned and impacted into place. Excess cement was removed using Personal assistantreer elevators. Cement was then applied to the cut surfaces of the femur as well as along the posterior flanges of the size 5N femoral component. The femoral component was positioned and impacted into place. Excess cement was removed using Personal assistantreer elevators. An 8 mm polyethylene trial was inserted and the knee was brought into full extension with steady axial compression applied. Finally, cement was applied to the backside of a 38 mm medialized dome patella and the  patellar component was positioned and patellar clamp applied. Excess cement was removed using Personal assistantreer elevators. After adequate curing of the cement, the tourniquet was deflated after a total tourniquet time of 110 minutes. Hemostasis was achieved using electrocautery. The knee was irrigated with copious amounts of normal saline with antibiotic solution using pulsatile lavage and then suctioned dry. 20 mL of 1.3% Exparel and 60 mL of 0.25% Marcaine in 40 mL of normal saline was injected along the posterior capsule, medial and lateral gutters, and along the arthrotomy site. An 8 mm stabilized rotating platform polyethylene insert was inserted and the knee was placed through a range of motion with excellent mediolateral soft tissue balancing appreciated and excellent patellar tracking noted. 2 medium drains were placed in the wound bed and brought out through separate stab incisions to be attached to a reinfusion system. The medial parapatellar portion of the incision was reapproximated using interrupted sutures of #1 Vicryl. Subcutaneous tissue was approximated in layers using first #0 Vicryl followed #2-0 Vicryl. The skin was approximated with skin staples. A sterile dressing was applied.  The patient tolerated the procedure well and was transported to the recovery room in stable condition.    James P. Angie FavaHooten, Jr., M.D.

## 2016-12-05 NOTE — Progress Notes (Signed)
Anticoagulation monitoring(Lovenox):  76 yo female ordered Lovenox 30 mg Q12h  Filed Weights   12/05/16 1927  Weight: 217 lb 14.4 oz (98.8 kg)   BMI 41.3    Lab Results  Component Value Date   CREATININE 0.71 11/21/2016   Estimated Creatinine Clearance: 66.4 mL/min (by C-G formula based on SCr of 0.71 mg/dL). Hemoglobin & Hematocrit     Component Value Date/Time   HGB 12.7 11/21/2016 1358   HCT 37.3 11/21/2016 1358     Per Protocol for Patient with estCrcl > 30 ml/min and BMI > 40, will transition to Lovenox 40 mg Q12h.

## 2016-12-06 ENCOUNTER — Encounter: Payer: Self-pay | Admitting: Orthopedic Surgery

## 2016-12-06 LAB — BASIC METABOLIC PANEL
Anion gap: 6 (ref 5–15)
BUN: 10 mg/dL (ref 6–20)
CO2: 25 mmol/L (ref 22–32)
Calcium: 8.8 mg/dL — ABNORMAL LOW (ref 8.9–10.3)
Chloride: 108 mmol/L (ref 101–111)
Creatinine, Ser: 0.66 mg/dL (ref 0.44–1.00)
GFR calc Af Amer: >60 mL/min (ref >60)
GFR calc non Af Amer: >60 mL/min (ref >60)
Glucose, Bld: 145 mg/dL — ABNORMAL HIGH (ref 65–99)
Potassium: 4.4 mmol/L (ref 3.5–5.1)
Sodium: 139 mmol/L (ref 135–145)

## 2016-12-06 LAB — CBC
HEMATOCRIT: 35.4 % (ref 35.0–47.0)
Hemoglobin: 12.1 g/dL (ref 12.0–16.0)
MCH: 28.3 pg (ref 26.0–34.0)
MCHC: 34.3 g/dL (ref 32.0–36.0)
MCV: 82.5 fL (ref 80.0–100.0)
Platelets: 197 10*3/uL (ref 150–440)
RBC: 4.29 MIL/uL (ref 3.80–5.20)
RDW: 15.1 % — ABNORMAL HIGH (ref 11.5–14.5)
WBC: 9.9 10*3/uL (ref 3.6–11.0)

## 2016-12-06 MED ORDER — RIVAROXABAN 10 MG PO TABS
10.0000 mg | ORAL_TABLET | Freq: Every day | ORAL | Status: DC
  Administered 2016-12-06 – 2016-12-08 (×3): 10 mg via ORAL
  Filled 2016-12-06 (×4): qty 1

## 2016-12-06 NOTE — Progress Notes (Signed)
Clinical Social Worker (CSW) received SNF consult. PT is recommending that patient can go home. RN case manager aware of above. Please reconsult if future social work needs arise. CSW signing off.   Javione Gunawan, LCSW [phone] 

## 2016-12-06 NOTE — Evaluation (Signed)
Physical Therapy Evaluation Patient Details Name: Emily Fox MRN: [id number] DOB: 03-01-1941 Today's Date: 12/06/2016   History of Present Illness  Pt underwent L TKR without reported post-op complications. She is POD#1 at time of PT evaluation. Pt reports prior history of R partial TKR in 2014  Clinical Impression  Pt admitted with above diagnosis. Pt currently with functional limitations due to the deficits listed below (see PT Problem List).  Pt does very well with therapy for her first session. She demonstrates excellent LLE strength and AAROM (0-88 degrees). She is able to transfer and ambulate with CGA only. Pt able to make it to door and back to recliner with good stability and safety noted. Good sequencing with walker during ambulation and transfers. Pt would prefer to go straight to OP PT instead of HH PT. She has all the DME that she needs at this time. Pt will benefit from skilled PT services to address deficits in strength, balance, and mobility in order to return to full function at home.     Follow Up Recommendations Outpatient PT;Other (comment) (Pt would like to go straight to OP PT)    Equipment Recommendations  None recommended by PT    Recommendations for Other Services       Precautions / Restrictions Precautions Precautions: Fall;Knee Precaution Booklet Issued: Yes (comment) Required Braces or Orthoses: Knee Immobilizer - Left Knee Immobilizer - Left: Discontinue once straight leg raise with < 10 degree lag Restrictions Weight Bearing Restrictions: Yes LLE Weight Bearing: Weight bearing as tolerated      Mobility  Bed Mobility Overal bed mobility: Needs Assistance Bed Mobility: Supine to Sit     Supine to sit: Supervision     General bed mobility comments: Pt demonstrates ability to come upright to sitting at EOB without external assistance. HOB mildly elevated and no bed rails required. Takes increased time  Transfers Overall transfer level: Needs  assistance Equipment used: Rolling walker (2 wheeled) Transfers: Sit to/from Stand Sit to Stand: Min guard         General transfer comment: Pt provided cues for safe hand placement and sequencing with transfers. She is able to come up to standing and is stable with bilateral UE support. Decreased weight shifting to LLE but good L knee flexion noted.  Ambulation/Gait Ambulation/Gait assistance: Min guard Ambulation Distance (Feet): 20 Feet Assistive device: Rolling walker (2 wheeled) Gait Pattern/deviations: Step-to pattern;Decreased step length - right;Decreased weight shift to left Gait velocity: Decreased Gait velocity interpretation: <1.8 ft/sec, indicative of risk for recurrent falls General Gait Details: Pt able to ambulate with rolling walker from bed to door and back to recliner. Cues and education provided for proper sequencing for ambulation and turns. Pt demonstrates decreased weight shifting to LLE. She is safe and steady with UE support on walker. Pt reports mild increase in pain. No DOE reported during ambulation.  Stairs            Wheelchair Mobility    Modified Rankin (Stroke Patients Only)       Balance Overall balance assessment: Needs assistance Sitting-balance support: No upper extremity supported Sitting balance-Leahy Scale: Good     Standing balance support: Bilateral upper extremity supported Standing balance-Leahy Scale: Fair                               Pertinent Vitals/Pain Pain Assessment: 0-10 Pain Score: 7  Pain Location: L knee Pain Descriptors / Indicators: Throbbing  Pain Intervention(s): Monitored during session;Premedicated before session    Home Living Family/patient expects to be discharged to:: Skilled nursing facility Living Arrangements: Spouse/significant other Available Help at Discharge: Family Type of Home: Mobile home Home Access: Stairs to enter Entrance Stairs-Rails: Right Entrance Stairs-Number of  Steps: 2 Home Layout: One level Home Equipment: Walker - 2 wheels;Crutches;Bedside commode (no shower chair)      Prior Function Level of Independence: Independent with assistive device(s)         Comments: Independent with ADLs/IADLs. Occasional use of axillary crutch under RUE secondary to L knee pain. Drives and exercises     Hand Dominance   Dominant Hand: Right    Extremity/Trunk Assessment   Upper Extremity Assessment Upper Extremity Assessment: Overall WFL for tasks assessed    Lower Extremity Assessment Lower Extremity Assessment: LLE deficits/detail LLE Deficits / Details: Pt reports intact sensation to light touch in LLE. Full DF/PF against resistance. Able to perform L SLR and SAQ without assistance       Communication   Communication: No difficulties  Cognition Arousal/Alertness: Awake/alert Behavior During Therapy: WFL for tasks assessed/performed Overall Cognitive Status: Within Functional Limits for tasks assessed                      General Comments      Exercises Total Joint Exercises Ankle Circles/Pumps: Strengthening;Both;10 reps;Supine Quad Sets: Strengthening;Both;10 reps;Supine Gluteal Sets: Strengthening;Both;10 reps;Supine Towel Squeeze: Strengthening;Both;10 reps;Supine Short Arc Quad: Strengthening;Left;10 reps;Supine Heel Slides: Strengthening;Left;10 reps;Supine Hip ABduction/ADduction: Strengthening;Left;10 reps;Supine Straight Leg Raises: Strengthening;Left;10 reps;Supine Goniometric ROM: 0-88 degrees AAROM, pain limited for flexion   Assessment/Plan    PT Assessment Patient needs continued PT services  PT Problem List Decreased strength;Decreased range of motion;Decreased balance;Decreased mobility;Decreased knowledge of use of DME;Obesity;Pain          PT Treatment Interventions DME instruction;Gait training;Stair training;Functional mobility training;Therapeutic activities;Therapeutic exercise;Balance  training;Neuromuscular re-education;Patient/family education;Manual techniques    PT Goals (Current goals can be found in the Care Plan section)  Acute Rehab PT Goals Patient Stated Goal: Improve LLE strength and overall function with less pain  PT Goal Formulation: With patient/family Time For Goal Achievement: 12/20/16 Potential to Achieve Goals: Good    Frequency BID   Barriers to discharge   2 steps to enter    Co-evaluation               End of Session Equipment Utilized During Treatment: Gait belt Activity Tolerance: Patient tolerated treatment well Patient left: in chair;with call bell/phone within reach;with chair alarm set;with SCD's reapplied;Other (comment) (polar care in place, towel roll under heel) Nurse Communication: Mobility status         Time: 1610-9604 PT Time Calculation (min) (ACUTE ONLY): 37 min   Charges:   PT Evaluation $PT Eval Low Complexity: 1 Procedure PT Treatments $Therapeutic Exercise: 8-22 mins   PT G Codes:       Sharalyn Ink Huprich PT, DPT   Huprich,Jason 12/06/2016, 11:11 AM

## 2016-12-06 NOTE — Progress Notes (Signed)
ORTHOPAEDICS PROGRESS NOTE  PATIENT NAME: Emily Fox DOB: 03-09-1941  MRN: [id number]  POD # 1: Left total knee arthroplasty  Subjective: Patient is in good spirits this morning. Pain is well-controlled. She denies any nausea. The patient's husband spent the night with her last night.  Objective: Vital signs in last 24 hours: Temp:  [97.1 F (36.2 C)-98.6 F (37 C)] 98.2 F (36.8 C) (01/18 0456) Pulse Rate:  [71-87] 77 (01/18 0456) Resp:  [9-20] 18 (01/18 0456) BP: (102-154)/(57-81) 132/57 (01/18 0456) SpO2:  [94 %-99 %] 94 % (01/18 0456) Weight:  [98.8 kg (217 lb 14.4 oz)] 98.8 kg (217 lb 14.4 oz) (01/17 1927)  Intake/Output from previous day: 01/17 0701 - 01/18 0700 In: 3186.7 [I.V.:3076.7; IV Piggyback:110] Out: 4625 [Urine:4425; Drains:125; Blood:75]   Recent Labs  12/06/16 0501  WBC 9.9  HGB 12.1  HCT 35.4  PLT 197  K 4.4  CL 108  CO2 25  BUN 10  CREATININE 0.66  GLUCOSE 145*  CALCIUM 8.8*    EXAM General: Well-developed well-nourished obese female seen in no acute distress. Lungs: clear to auscultation Cardiac: normal rate, regular rhythm, normal S1, S2, no murmurs, rubs, clicks or gallops, normal rate and regular rhythm Left lower extremity: Dressing is dry and intact. The patient is able to perform independent straight leg raise. Homans test is negative. Neurologic: Awake, alert, and oriented. Sensory motor function are intact.  Assessment: Left total knee arthroplasty  Secondary diagnoses: Psoriatic arthritis Overactive bladder Lower extremity edema Morbid obesity Hypertension Gastroesophageal reflux disease COPD Asthma  Plan: Today's goal were reviewed with the patient.  She did extremely well with physical therapy this morning. PT anticipates the patient will be able to be discharged to home and the patient requests outpatient physical therapy instead of home PT. Labs in the morning. Plan is to go Home after hospital stay. DVT  Prophylaxis - Lovenox, Foot Pumps and TED hose  Emily Fox P. Angie FavaHooten, Jr. M.D.

## 2016-12-06 NOTE — Progress Notes (Signed)
Physical Therapy Treatment Patient Details Name: Emily Fox MRN: [id number] DOB: [birth year] Today's Date: 12/06/2016    History of Present Illness Pt underwent L TKR without reported post-op complications. Reports she had progressive pain and decreased motion and decided to follow through with surgery. 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 She is able to walk almost a half lap around RN station and back to bed. Pt progresses to step-through pattern and demonstrates good stability with use of rolling walker. Only minimal increase in pain/stiffness during ambulation. Pt demonstrates excellent LLE strength with seated exercises. She is able to perform full LLE LAQ without assistance. Spent additional time educating spouse and children regarding discharging home, use of polar care, steps, and transfers onto high bed at home. Pt still appears mildly anxious about discharging home instead of SNF but at this time she is doing excellent with therapy and no obvious safety concerns exist. Husband is mildly anxious about the road conditions near their home. Encouraged husband to talk with Dr. Ernest PineHooten regarding discharge dates. Will practice steps tomorrow with patient as well as attempt a full lap around RN station. Pt will benefit from skilled PT services to address deficits in strength, balance, and mobility in order to return to full function at home.    Follow Up Recommendations  Outpatient PT;Other (comment) (Pt would like to go straight to OP PT)     Equipment Recommendations  None recommended by PT    Recommendations for Other Services       Precautions / Restrictions Precautions Precautions: Fall;Knee Precaution Booklet Issued: Yes (comment) Required Braces or Orthoses: Knee Immobilizer - Left Knee Immobilizer - Left: Discontinue once straight leg raise with < 10 degree  lag Restrictions Weight Bearing Restrictions: Yes LLE Weight Bearing: Weight bearing as tolerated    Mobility  Bed Mobility Overal bed mobility: Needs Assistance Bed Mobility: Supine to Sit     Supine to sit: Supervision     General bed mobility comments: Pt continues to demonstrates excellent ability to perform bed mobility without external assistance. Good L hip flexion and adduction strength noted when exiting bed on the R side.   Transfers Overall transfer level: Needs assistance Equipment used: Rolling walker (2 wheeled) Transfers: Sit to/from Stand Sit to Stand: Min guard         General transfer comment: Pt demonstrates improved speed/sequencing this afternoon with transfers. Improved weight shifting to LLE during transfer. Safe hand placement demonstrated  Ambulation/Gait Ambulation/Gait assistance: Min guard Ambulation Distance (Feet): 160 Feet Assistive device: Rolling walker (2 wheeled) Gait Pattern/deviations: Step-to pattern;Decreased step length - right;Decreased weight shift to left Gait velocity: WFL for household mobility Gait velocity interpretation: <1.8 ft/sec, indicative of risk for recurrent falls General Gait Details: Pt able to ambulate almost a half lap around RN station with rolling walker. No DOE and good stability noted. Pt able to progress to step-through pattern with gait. Reports minimal increase in stiffness/pain. Pt demonstrates good upright posture and does not requires a standing rest break during ambulation   Stairs            Wheelchair Mobility    Modified Rankin (Stroke Patients Only)       Balance Overall balance assessment: Needs assistance Sitting-balance support: No upper extremity supported Sitting balance-Leahy Scale: Good     Standing balance support: Bilateral upper extremity supported Standing balance-Leahy Scale: Fair  Cognition Arousal/Alertness: Awake/alert Behavior During  Therapy: WFL for tasks assessed/performed Overall Cognitive Status: Within Functional Limits for tasks assessed                      Exercises Total Joint Exercises Ankle Circles/Pumps: Strengthening;Left;15 reps;Seated;Other (comment) ("Heel raises") Quad Sets: Strengthening;Both;10 reps;Supine Gluteal Sets: Strengthening;Both;10 reps;Supine Towel Squeeze: Strengthening;Both;10 reps;Supine Short Arc Quad: Strengthening;Left;10 reps;Supine Heel Slides: Strengthening;Left;10 reps;Supine Hip ABduction/ADduction: Strengthening;Seated;15 reps;Both Straight Leg Raises: Strengthening;Left;10 reps;Supine Long Arc Quad: Strengthening;Left;15 reps;Seated Knee Flexion: Strengthening;Left;15 reps;Seated Marching in Standing: Strengthening;Both;15 reps;Seated    General Comments        Pertinent Vitals/Pain Pain Assessment: 0-10 Pain Score: 4  Pain Location: L knee Pain Descriptors / Indicators: Throbbing Pain Intervention(s): Monitored during session    Home Living Family/patient expects to be discharged to:: Private residence Living Arrangements: Spouse/significant other Available Help at Discharge: Family Type of Home: Mobile home Home Access: Stairs to enter Entrance Stairs-Rails: Right Home Layout: One level Home Equipment: Dan Humphreys - 2 wheels;Crutches      Prior Function Level of Independence: Independent with assistive device(s)      Comments: Independent with ADLs/IADLs.  Drives and exercises 2 times a week, she is retired.  She reports she can not use a cane in her right hand due to arthritis.    PT Goals (current goals can now be found in the care plan section) Acute Rehab PT Goals Patient Stated Goal: to get back home and be independent PT Goal Formulation: With patient/family Time For Goal Achievement: 12/20/16 Potential to Achieve Goals: Good Progress towards PT goals: Progressing toward goals    Frequency    BID      PT Plan Current plan remains  appropriate    Co-evaluation             End of Session Equipment Utilized During Treatment: Gait belt Activity Tolerance: Patient tolerated treatment well Patient left: with SCD's reapplied;Other (comment);in bed;with bed alarm set;with call bell/phone within reach;with family/visitor present (polar care in place, bone foam in place)     Time: 1610-9604 PT Time Calculation (min) (ACUTE ONLY): 40 min  Charges:  $Gait Training: 8-22 mins $Therapeutic Exercise: 8-22 mins $Therapeutic Activity: 8-22 mins                    G Codes:      Sharalyn Ink Cella Cappello PT, DPT   Safina Huard 12/06/2016, 3:48 PM

## 2016-12-06 NOTE — Care Management (Addendum)
Case discussed with physical therapy. Patient prefers to go straight to outpatient PT. She has all the DME needed. Spoke with Dr. Ernest PineHooten and he is agreeable with OP PT if patient can get an appointment. [facility] to [facility] with no answer.   Dr. Ernest PineHooten states he will try and get patient [facility]

## 2016-12-06 NOTE — Progress Notes (Signed)
Pt was drowsy at the beginning of the shift. Tolerated sitting on side of the bed with minimal discomfort. Dsg dry and intact.. Hemovac in place with no scant amt of bloody drainage. VSS. Polar care in place. Medicated for pain once during the night. No acute distress noted. Husband at bedside. Foley remove at 0510. Pt is alert and oriented at present time

## 2016-12-06 NOTE — Evaluation (Signed)
Occupational Therapy Evaluation Patient Details Name: Emily Fox MRN: [id number] DOB: 05-08-1941 Today's Date: 12/06/2016    History of Present Illness Pt underwent L TKR without reported post-op complications. Reports she had progressive pain and decreased motion and decided to follow through with surgery. 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  She lives at home with her husband in a mobile home with 2 steps to enter, she has had knee surgery in the past and did well, reports her husband helped her then but not sure he can help as much this time as he is older.  She is able to transfer with min guard from the bed, commode and chair.  She requires min assist at this point for lower body self care and would benefit from use of adaptive equipment to help with improving independence.  She presents with muscle weakness, decreased mobility and decreased ability to perform self care tasks.  She would benefit from skilled OT to maximize safety and independence in daily tasks to be able to return home.      Follow Up Recommendations  No OT follow up (she may not require follow up OT if she continues to progress. )    Equipment Recommendations  3 in 1 bedside commode    Recommendations for Other Services       Precautions / Restrictions Precautions Precautions: Fall;Knee Precaution Booklet Issued: (P) Yes (comment) Required Braces or Orthoses: Knee Immobilizer - Left Knee Immobilizer - Left: Discontinue once straight leg raise with < 10 degree lag Restrictions Weight Bearing Restrictions: Yes LLE Weight Bearing: Weight bearing as tolerated      Mobility Bed Mobility Overal bed mobility: Needs Assistance Bed Mobility: Supine to Sit     Supine to sit: Supervision     General bed mobility comments: (P) Pt demonstrates ability to come upright to sitting at EOB without external assistance. HOB mildly elevated  and no bed rails required. Takes increased time  Transfers Overall transfer level: Needs assistance Equipment used: Rolling walker (2 wheeled) Transfers: Sit to/from Stand Sit to Stand: Min guard         General transfer comment: (P) Pt provided cues for safe hand placement and sequencing with transfers. She is able to come up to standing and is stable with bilateral UE support. Decreased weight shifting to LLE but good L knee flexion noted.    Balance Overall balance assessment: (P) Needs assistance Sitting-balance support: (P) No upper extremity supported Sitting balance-Leahy Scale: (P) Good     Standing balance support: (P) Bilateral upper extremity supported Standing balance-Leahy Scale: (P) Fair                              ADL Overall ADL's : Needs assistance/impaired Eating/Feeding: Independent   Grooming: Sitting;Set up   Upper Body Bathing: Set up;Sitting Upper Body Bathing Details (indicate cue type and reason): sponge bath Lower Body Bathing: Set up;Minimal assistance   Upper Body Dressing : Set up;Sitting   Lower Body Dressing: Minimal assistance   Toilet Transfer: Min guard;Comfort height toilet Toilet Transfer Details (indicate cue type and reason): ambulated from bed into bathroom for toileting on elevated commode seat Toileting- Clothing Manipulation and Hygiene: Min guard       Functional mobility during ADLs: Min guard       Vision     Perception  Praxis      Pertinent Vitals/Pain Pain Assessment: 0-10 Pain Score: 5  Pain Location: L knee Pain Descriptors / Indicators: Throbbing Pain Intervention(s): Limited activity within patient's tolerance;Monitored during session;RN gave pain meds during session     Hand Dominance Right   Extremity/Trunk Assessment Upper Extremity Assessment Upper Extremity Assessment: Generalized weakness;RUE deficits/detail RUE Deficits / Details: previous right rotator cuff issues and  arthritis in hands.  R 3/5 overall, L 3+/5 overall.    Lower Extremity Assessment Lower Extremity Assessment: Defer to PT evaluation       Communication Communication Communication: No difficulties   Cognition Arousal/Alertness: Awake/alert Behavior During Therapy: WFL for tasks assessed/performed Overall Cognitive Status: Within Functional Limits for tasks assessed                     General Comments       Exercises Exercises: (P) Total Joint     Shoulder Instructions      Home Living Family/patient expects to be discharged to:: Private residence Living Arrangements: Spouse/significant other Available Help at Discharge: Family Type of Home: Mobile home Home Access: Stairs to enter Secretary/administratorntrance Stairs-Number of Steps: 2 Entrance Stairs-Rails: Right Home Layout: One level     Bathroom Shower/Tub: Tub/shower unit Shower/tub characteristics: Engineer, building servicesCurtain Bathroom Toilet: Standard     Home Equipment: Environmental consultantWalker - 2 wheels;Crutches          Prior Functioning/Environment Level of Independence: Independent with assistive device(s)        Comments: Independent with ADLs/IADLs.  Drives and exercises 2 times a week, she is retired.  She reports she can not use a cane in her right hand due to arthritis.         OT Problem List: Decreased strength;Impaired balance (sitting and/or standing);Pain;Decreased range of motion;Decreased knowledge of use of DME or AE   OT Treatment/Interventions: Self-care/ADL training;DME and/or AE instruction;Balance training;Therapeutic exercise;Patient/family education    OT Goals(Current goals can be found in the care plan section) Acute Rehab OT Goals Patient Stated Goal: to get back home and be independent OT Goal Formulation: With patient Time For Goal Achievement: 12/15/16 Potential to Achieve Goals: Good  OT Frequency: Min 1X/week   Barriers to D/C:            Co-evaluation              End of Session Equipment Utilized  During Treatment: Gait belt;Rolling walker  Activity Tolerance: Patient tolerated treatment well Patient left: in bed;with call bell/phone within reach;with bed alarm set   Time: [id number] OT Time Calculation (min): 35 min Charges:  OT General Charges $OT Visit: 1 Procedure OT Evaluation $OT Eval Low Complexity: 1 Procedure OT Treatments $Self Care/Home Management : 8-22 mins G-Codes:    Lovett,Amy  Amy T Lovett, OTR/L, CLT  12/06/2016, 2:16 PM

## 2016-12-06 NOTE — Anesthesia Postprocedure Evaluation (Signed)
Anesthesia Post Note  Patient: Lydia Guilesovella Potteiger  Procedure(s) Performed: Procedure(s) (LRB): COMPUTER ASSISTED TOTAL KNEE ARTHROPLASTY (Left)  Patient location during evaluation: Nursing Unit Anesthesia Type: Spinal Level of consciousness: awake and alert Pain management: pain level controlled Vital Signs Assessment: post-procedure vital signs reviewed and stable Respiratory status: spontaneous breathing, respiratory function stable and nonlabored ventilation Cardiovascular status: stable Postop Assessment: no headache, no backache, patient able to bend at knees, adequate PO intake and no signs of nausea or vomiting Anesthetic complications: no     Last Vitals:  Vitals:   12/05/16 2326 12/06/16 0456  BP: 140/69 (!) 132/57  Pulse: 87 77  Resp: 18 18  Temp: 36.9 C 36.8 C    Last Pain:  Vitals:   12/06/16 0540  TempSrc:   PainSc: 6                  Marlana SalvageSandra Onix Jumper

## 2016-12-07 LAB — CBC
HCT: 32.8 % — ABNORMAL LOW (ref 35.0–47.0)
Hemoglobin: 10.7 g/dL — ABNORMAL LOW (ref 12.0–16.0)
MCH: 27.4 pg (ref 26.0–34.0)
MCHC: 32.6 g/dL (ref 32.0–36.0)
MCV: 84.2 fL (ref 80.0–100.0)
Platelets: 177 10*3/uL (ref 150–440)
RBC: 3.9 MIL/uL (ref 3.80–5.20)
RDW: 15 % — ABNORMAL HIGH (ref 11.5–14.5)
WBC: 12.1 10*3/uL — ABNORMAL HIGH (ref 3.6–11.0)

## 2016-12-07 LAB — BASIC METABOLIC PANEL
ANION GAP: 5 (ref 5–15)
BUN: 23 mg/dL — ABNORMAL HIGH (ref 6–20)
CALCIUM: 8.5 mg/dL — AB (ref 8.9–10.3)
CO2: 26 mmol/L (ref 22–32)
Chloride: 107 mmol/L (ref 101–111)
Creatinine, Ser: 0.93 mg/dL (ref 0.44–1.00)
GFR, EST NON AFRICAN AMERICAN: 59 mL/min — AB (ref >60)
Glucose, Bld: 111 mg/dL — ABNORMAL HIGH (ref 65–99)
Potassium: 4 mmol/L (ref 3.5–5.1)
SODIUM: 138 mmol/L (ref 135–145)

## 2016-12-07 NOTE — Progress Notes (Signed)
Occupational Therapy Treatment Patient Details Name: Emily Fox MRN: [id number] DOB: 1941/05/29 Today's Date: 12/07/2016    History of present illness Pt underwent L TKR without reported post-op complications. Reports she had progressive pain and decreased motion and decided to follow through with surgery. 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 Patient doffing sock with and without reacher and cues.  Donning left sock with sock aid 2 trials with verbal instruction.  Patient ambulating to and from the bathroom with supervision using RW, toilet transfers with supervision, grooming at the sink supervision.  Patient has progressed well and do not anticipate OT needs after discharge from hospital.  Husband is available to assist with care as needed.    Follow Up Recommendations  No OT follow up    Equipment Recommendations  3 in 1 bedside commode    Recommendations for Other Services      Precautions / Restrictions Precautions Precautions: Fall;Knee Precaution Booklet Issued: Yes (comment) Restrictions Weight Bearing Restrictions: Yes LLE Weight Bearing: Weight bearing as tolerated Other Position/Activity Restrictions: Pt able to perform independent L SLR without lag        Mobility Bed Mobility Overal bed mobility: Needs Assistance Bed Mobility: Supine to Sit;Sit to Supine     Supine to sit: Supervision Sit to supine: Supervision      Transfers Overall transfer level: Needs assistance Equipment used: Rolling walker (2 wheeled) Transfers: Sit to/from Stand Sit to Stand: Supervision              Balance Overall balance assessment: Needs assistance Sitting-balance support: Bilateral upper extremity supported Sitting balance-Leahy Scale: Good     Standing balance support: Bilateral upper extremity  supported Standing balance-Leahy Scale: Good                     ADL Overall ADL's : Needs assistance/impaired     Grooming: Standing;Supervision/safety           Upper Body Dressing : Modified independent   Lower Body Dressing: Set up;Cueing for compensatory techniques Lower Body Dressing Details (indicate cue type and reason): Instructed on LB dressing with AE, reacher and sockaid, cues for use Toilet Transfer: Supervision/safety;Comfort height toilet Toilet Transfer Details (indicate cue type and reason): ambulated from chair into bathroom for toileting on elevated commode seat Toileting- Clothing Manipulation and Hygiene: Supervision/safety       Functional mobility during ADLs: Min guard;Supervision/safety        Vision                     Perception     Praxis      Cognition   Behavior During Therapy: [facility] for tasks assessed/performed Overall Cognitive Status: Within Functional Limits for tasks assessed                       Extremity/Trunk Assessment               Exercises Total Joint Exercises Ankle Circles/Pumps: Strengthening;Both;10 reps Quad Sets: AROM;Left;10 reps;15 reps Straight Leg Raises: AROM;Left;5 reps;AAROM;10 reps Long Arc Quad: AROM;Left;10 reps;15 reps Knee Flexion: AROM;Left;10 reps;15 reps Goniometric ROM: L knee flex A/AAROM 68/80 deg with pain limiting flexion; extension 0 deg Other Exercises Other Exercises: HEP training with BLE APs, QS, and GS   Shoulder  Instructions       General Comments      Pertinent Vitals/ Pain       Pain Assessment: No/denies pain Pain Score: 0-No pain Pain Location: L knee Pain Descriptors / Indicators: Aching Pain Intervention(s): Premedicated before session;Monitored during session;Limited activity within patient's tolerance  Home Living                                          Prior Functioning/Environment              Frequency  Min  1X/week        Progress Toward Goals  OT Goals(current goals can now be found in the care plan section)  Progress towards OT goals: Progressing toward goals  Acute Rehab OT Goals Patient Stated Goal: to get back home and be independent OT Goal Formulation: With patient Time For Goal Achievement: 12/15/16 Potential to Achieve Goals: Good  Plan Discharge plan remains appropriate    Co-evaluation                 End of Session Equipment Utilized During Treatment: Gait belt;Rolling walker   Activity Tolerance Patient tolerated treatment well   Patient Left in chair;with call bell/phone within reach;with family/visitor present   Nurse Communication          Time: [id number] OT Time Calculation (min): 26 min  Charges: OT General Charges $OT Visit: 1 Procedure OT Treatments $Self Care/Home Management : 23-37 mins  Akeiba Axelson  Hang Ammon T Jamicia Haaland, OTR/L, CLT  12/07/2016, 3:32 PM

## 2016-12-07 NOTE — Progress Notes (Signed)
ORTHOPAEDICS PROGRESS NOTE  PATIENT NAME: Emily Fox DOB: April 26, 1941  MRN: [id number]  POD # 2: Left total knee arthroplasty  Subjective: Pain is well controlled, but the patient states that she is somewhat more sore than she was yesterday. No nausea. Fair appetite.  Objective: Vital signs in last 24 hours: Temp:  [97.9 F (36.6 C)-98.2 F (36.8 C)] 97.9 F (36.6 C) (01/19 1507) Pulse Rate:  [70-89] 89 (01/19 1507) Resp:  [18] 18 (01/19 1507) BP: (114-143)/(41-63) 140/63 (01/19 1507) SpO2:  [95 %-97 %] 95 % (01/19 0739)  Recent Labs  12/06/16 0501 12/07/16 0433  WBC 9.9 12.1*  HGB 12.1 10.7*  HCT 35.4 32.8*  PLT 197 177  K 4.4 4.0  CL 108 107  CO2 25 26  BUN 10 23*  CREATININE 0.66 0.93  GLUCOSE 145* 111*  CALCIUM 8.8* 8.5*   EXAM General: Well-developed well-nourished female seen in no acute distress. Left lower extremity: Drains were removed and dressing change. Surgical incision is well approximated with skin staples. No significant drainage or ecchymosis. Homans test is negative. Neurologic: Awake, alert, and oriented. Sensory motor function are intact.  Assessment: Left total knee arthroplasty  Secondary diagnoses: Anemia secondary to acute blood loss (perioperative) Psoriatic arthritis Overactive bladder Lower extremity edema Morbid obesity Hypertension Gastroesophageal reflux disease COPD Asthma  Plan: Patient needs a bowel movement before discharge. Anticipate discharge to home tomorrow. Continue with physical therapy and occupational therapy. The patient requests outpatient physical therapy. Plan is to go Home after hospital stay. DVT Prophylaxis - Xarelto, Foot Pumps and TED hose  Leaner Morici P. Angie FavaHooten, Jr. M.D.

## 2016-12-07 NOTE — Plan of Care (Signed)
Problem: Pain Managment: Goal: General experience of comfort will improve Outcome: Progressing Instructing patient not to go for long periods without pain meds.

## 2016-12-07 NOTE — Progress Notes (Signed)
Physical Therapy Treatment Patient Details Name: Emily Fox MRN: [id number] DOB: September 11, 1941 Today's Date: 12/07/2016    History of Present Illness Pt underwent L TKR without reported post-op complications. Reports she had progressive pain and decreased motion and decided to follow through with surgery. Pt reports prior history of R partial TKR in 2014    PT Comments    Pt continues to present with deficits in strength, transfers, mobility, gait, balance, L knee ROM, and activity tolerance.  Pt required SBA with bed mobility with extra time/effort needed.  Pt CGA with transfers and was able to amb 2 x 150' with CGA and RW with slow cadence and R step length < L but with good stability.  Pt able to ascend/descend 1 step and then 3 steps with CGA and B rails with min verbal cues for sequencing again with good control/stability.  Pt will benefit from PT services to address above deficits for decreased caregiver assistance upon discharge.    Follow Up Recommendations  Outpatient PT (Per pt request)     Equipment Recommendations  None recommended by PT    Recommendations for Other Services       Precautions / Restrictions Precautions Precautions: Fall;Knee Precaution Booklet Issued: Yes (comment) Restrictions Weight Bearing Restrictions: Yes LLE Weight Bearing: Weight bearing as tolerated Other Position/Activity Restrictions: Pt able to perform independent L SLR without lag     Mobility  Bed Mobility Overal bed mobility: Needs Assistance Bed Mobility: Supine to Sit;Sit to Supine     Supine to sit: Supervision Sit to supine: Supervision      Transfers Overall transfer level: Needs assistance Equipment used: Rolling walker (2 wheeled) Transfers: Sit to/from Stand Sit to Stand: Min guard            Ambulation/Gait Ambulation/Gait assistance: Min guard Ambulation Distance (Feet): 150 Feet Assistive device: Rolling walker (2 wheeled) Gait Pattern/deviations:  Decreased step length - right   Gait velocity interpretation: Below normal speed for age/gender General Gait Details: Slow cadence with gait but steady without LOB   Stairs Stairs: Yes   Stair Management: Two rails Number of Stairs: 3 General stair comments: Min verbal cues for sequencing, steady without LOB  Wheelchair Mobility    Modified Rankin (Stroke Patients Only)       Balance Overall balance assessment: Needs assistance Sitting-balance support: Bilateral upper extremity supported Sitting balance-Leahy Scale: Good     Standing balance support: Bilateral upper extremity supported Standing balance-Leahy Scale: Good                      Cognition Arousal/Alertness: Awake/alert Behavior During Therapy: WFL for tasks assessed/performed Overall Cognitive Status: Within Functional Limits for tasks assessed                      Exercises Total Joint Exercises Ankle Circles/Pumps: Strengthening;Both;10 reps Quad Sets: AROM;Left;10 reps;15 reps Straight Leg Raises: AROM;Left;5 reps;AAROM;10 reps Long Arc Quad: AROM;Left;10 reps;15 reps Knee Flexion: AROM;Left;10 reps;15 reps Goniometric ROM: L knee flex A/AAROM 68/80 deg with pain limiting flexion; extension 0 deg Other Exercises Other Exercises: HEP training with BLE APs, QS, and GS    General Comments        Pertinent Vitals/Pain Pain Assessment: 0-10 Pain Score: 8  Pain Location: L knee Pain Descriptors / Indicators: Aching Pain Intervention(s): Premedicated before session;Monitored during session;Limited activity within patient's tolerance    Home Living  Prior Function            PT Goals (current goals can now be found in the care plan section) Progress towards PT goals: Progressing toward goals    Frequency    BID      PT Plan Current plan remains appropriate    Co-evaluation             End of Session Equipment Utilized During  Treatment: Gait belt Activity Tolerance: Patient tolerated treatment well Patient left: in bed;with bed alarm set;with family/visitor present;with call bell/phone within reach;with SCD's reapplied (Polar care and bone foam to LLE)     Time: [id number] PT Time Calculation (min) (ACUTE ONLY): 44 min  Charges:  $Gait Training: 8-22 mins $Therapeutic Exercise: 8-22 mins $Therapeutic Activity: 8-22 mins                    G Codes:      D. Scott Osmel Dykstra PT, DPT 12/07/16, 1:16 PM

## 2016-12-07 NOTE — Progress Notes (Signed)
Physical Therapy Treatment Patient Details Name: Emily Fox MRN: [id number] DOB: 28-May-1941 Today's Date: 12/07/2016    History of Present Illness Pt underwent L TKR without reported post-op complications. Reports she had progressive pain and decreased motion and decided to follow through with surgery. Pt reports prior history of R partial TKR in 2014    PT Comments    Pt continues to present with deficits in strength, transfers, mobility, gait, balance, L knee ROM, and activity tolerance but is overall doing well and progressing nicely towards goals.  Pt is able to perform sup to/from sit without physical assistance but requires extra time and effort.  Pt is safe and confident with transfers with SBA but also requires extra effort.  Pt was able to amb 200' with RW and SBA this session with only minimal SOB.  During balance training per below pt presented with good stability grossly.  Pt will benefit from PT services to address above deficits for decreased caregiver assistance upon discharge.     Follow Up Recommendations  Outpatient PT (Pt confirms still requesting OPPT vs HHPT)     Equipment Recommendations  None recommended by PT    Recommendations for Other Services       Precautions / Restrictions Precautions Precautions: Fall;Knee Precaution Booklet Issued: Yes (comment) Restrictions Weight Bearing Restrictions: Yes LLE Weight Bearing: Weight bearing as tolerated Other Position/Activity Restrictions: Pt able to perform independent L SLR without lag, no KI donned to LLE    Mobility  Bed Mobility Overal bed mobility: Needs Assistance Bed Mobility: Supine to Sit;Sit to Supine     Supine to sit: Supervision Sit to supine: Supervision   General bed mobility comments: Pt able to perform sup to/from sit without physical assistance but requires extra time and effort.  Transfers Overall transfer level: Needs assistance Equipment used: Rolling walker (2  wheeled) Transfers: Sit to/from Stand Sit to Stand: Supervision            Ambulation/Gait Ambulation/Gait assistance: Supervision Ambulation Distance (Feet): 200 Feet Assistive device: Rolling walker (2 wheeled) Gait Pattern/deviations: Decreased step length - right   Gait velocity interpretation: Below normal speed for age/gender General Gait Details: Slow cadence with gait but steady without LOB, activity tolerance progressing well   Stairs Stairs: Yes   Stair Management: Two rails Number of Stairs: 3 General stair comments: Min verbal cues for sequencing, steady without LOB  Wheelchair Mobility    Modified Rankin (Stroke Patients Only)       Balance Overall balance assessment: Needs assistance Sitting-balance support: No upper extremity supported Sitting balance-Leahy Scale: Good     Standing balance support: No upper extremity supported Standing balance-Leahy Scale: Good Standing balance comment: Static balance training performed with feet apart, together, and semi-tandem with combinations of eyes open/closed and head still/head turns.                    Cognition Arousal/Alertness: Awake/alert Behavior During Therapy: WFL for tasks assessed/performed Overall Cognitive Status: Within Functional Limits for tasks assessed                      Exercises Total Joint Exercises Ankle Circles/Pumps: AROM;Both;10 reps Quad Sets: AROM;Both;10 reps Gluteal Sets: AROM;Both;10 reps Hip ABduction/ADduction: AROM;Left;5 reps Straight Leg Raises: AROM;Left;5 reps Long Arc Quad: AROM;Left;10 reps;15 reps Knee Flexion: AROM;Both;10 reps;15 reps Goniometric ROM: L knee flex A/AAROM 68/80 deg with pain limiting flexion; extension 0 deg Other Exercises Other Exercises: HEP education review with BLE APs,  QS, and GS, positioning, and seated L knee flex x 10 each 5-6x/day    General Comments        Pertinent Vitals/Pain Pain Assessment: 0-10 Pain  Score: 0-No pain Pain Location: L knee Pain Descriptors / Indicators: Aching Pain Intervention(s): Monitored during session    Home Living                      Prior Function            PT Goals (current goals can now be found in the care plan section) Acute Rehab PT Goals Patient Stated Goal: to get back home and be independent Progress towards PT goals: Progressing toward goals    Frequency    BID      PT Plan Current plan remains appropriate    Co-evaluation             End of Session Equipment Utilized During Treatment: Gait belt Activity Tolerance: Patient tolerated treatment well Patient left: in chair;with SCD's reapplied;with call bell/phone within reach;with family/visitor present (Polar care and bone foam to LLE)     Time: 1410-1449 PT Time Calculation (min) (ACUTE ONLY): 39 min  Charges:  $Gait Training: 8-22 mins $Therapeutic Exercise: 8-22 mins $Therapeutic Activity: 8-22 mins                    G Codes:      DElly Modena PT, DPT 12/07/16, 4:14 PM

## 2016-12-07 NOTE — Care Management Important Message (Signed)
Important Message  Patient Details  Name: Emily Fox Rebel MRN: [id number] Date of Birth: 07-06-1941   Medicare Important Message Given:  Yes Copy of signed IM delivered to patient.   Collie SiadAngela Iyonna Rish, RN 12/07/2016, 8:10 AM

## 2016-12-07 NOTE — Care Management (Addendum)
Per Dr. Ernest PineHooten, patient will be discharged home on Xarelto. Called to Xarelto 10 mg 1 daily x 14 days ,no refills. [address] [place] [phone]. xarelto coupon given to patient.

## 2016-12-08 MED ORDER — LACTULOSE 10 GM/15ML PO SOLN
10.0000 g | Freq: Two times a day (BID) | ORAL | Status: DC
  Administered 2016-12-08: 10 g via ORAL
  Filled 2016-12-08: qty 30

## 2016-12-08 MED ORDER — OXYCODONE HCL 5 MG PO TABS: 5.0000 mg | ORAL_TABLET | ORAL | 0 refills | Status: DC | PRN

## 2016-12-08 MED ORDER — RIVAROXABAN 10 MG PO TABS: 10.0000 mg | ORAL_TABLET | Freq: Every day | ORAL | 0 refills | Status: DC

## 2016-12-08 NOTE — Progress Notes (Signed)
  Subjective: 3 Days Post-Op Procedure(s) (LRB): COMPUTER ASSISTED TOTAL KNEE ARTHROPLASTY (Left) Patient reports pain as mild.   Patient is well, and has had no acute complaints or problems Plan is to go Home after hospital stay. Negative for chest pain and shortness of breath Fever: no Gastrointestinal:Negative for nausea and vomiting  Objective: Vital signs in last 24 hours: Temp:  [97.9 F (36.6 C)-98.1 F (36.7 C)] 98.1 F (36.7 C) (01/20 0730) Pulse Rate:  [73-89] 81 (01/20 0730) Resp:  [18] 18 (01/20 0730) BP: (131-140)/(53-65) 135/65 (01/20 0730) SpO2:  [94 %-96 %] 96 % (01/20 0730)  Intake/Output from previous day:  Intake/Output Summary (Last 24 hours) at 12/08/16 0816 Last data filed at 12/07/16 1859  Gross per 24 hour  Intake             1320 ml  Output                0 ml  Net             1320 ml    Intake/Output this shift: No intake/output data recorded.  Labs:  Recent Labs  12/06/16 0501 12/07/16 0433  HGB 12.1 10.7*    Recent Labs  12/06/16 0501 12/07/16 0433  WBC 9.9 12.1*  RBC 4.29 3.90  HCT 35.4 32.8*  PLT 197 177    Recent Labs  12/06/16 0501 12/07/16 0433  NA 139 138  K 4.4 4.0  CL 108 107  CO2 25 26  BUN 10 23*  CREATININE 0.66 0.93  GLUCOSE 145* 111*  CALCIUM 8.8* 8.5*   No results for input(s): LABPT, INR in the last 72 hours.   EXAM General - Patient is Alert, Appropriate and Oriented Extremity - ABD soft Dorsiflexion/Plantar flexion intact Incision: dressing C/D/I No cellulitis present Dressing/Incision - clean, dry, no drainage Motor Function - intact, moving foot and toes well on exam.  Pt has normal bowel sounds.  Past Medical History:  Diagnosis Date  . Asthma   . Complication of anesthesia    difficulty waking up in 1980's   . COPD (chronic obstructive pulmonary disease) (HCC)   . Diverticula of colon   . GERD (gastroesophageal reflux disease)   . History of shingles   . Hypertension   . Increased  BMI   . Lichen sclerosus   . Lower extremity edema   . Menopause   . Osteoarthritis   . Overactive bladder   . Psoriatic arthritis (HCC)   . Seasonal allergies   . Urinary incontinence     Assessment/Plan: 3 Days Post-Op Procedure(s) (LRB): COMPUTER ASSISTED TOTAL KNEE ARTHROPLASTY (Left) Active Problems:   S/P total knee arthroplasty  Estimated body mass index is 41.17 kg/m as calculated from the following:   Height as of this encounter: 5\' 1"  (1.549 m).   Weight as of this encounter: 98.8 kg (217 lb 14.4 oz).  Plan will be for discharge home today with outpatient PT. Pt still has not had a BM, will move on to FLEET enema today. Once patient has had a BM, patient can be discharged home.  DVT Prophylaxis - Xarelto, Foot Pumps and TED hose Weight-Bearing as tolerated to left leg  J. Horris LatinoLance Levana Minetti, PA-C [facility] 12/08/2016, 8:16 AM

## 2016-12-08 NOTE — Care Management Note (Signed)
Case Management Note  Patient Details  Name: Lydia Guilesovella Weiler MRN: [id number] Date of Birth: 03-Oct-1941  Subjective/Objective:   Discussed discharge planning with Dr Ernest PineHooten this morning. [facility]row[facility] Ms Bonita QuinCrowson already has all DME. Has Xaralto coupon.                 Action/Plan:   Expected Discharge Date:  12/08/16               Expected Discharge Plan:     In-House Referral:     Discharge planning Services     Post Acute Care Choice:    Choice offered to:     DME Arranged:    DME Agency:     HH Arranged:    HH Agency:     Status of Service:     If discussed at [employer] Meetings, dates discussed:    Additional Comments:  Darbi Chandran A, RN 12/08/2016, 10:21 AM

## 2016-12-08 NOTE — Progress Notes (Signed)
Physical Therapy Treatment Patient Details Name: Emily Fox MRN: [id number] DOB: August 31, 1941 Today's Date: 12/08/2016    History of Present Illness Pt underwent L TKR without reported post-op complications. Reports she had progressive pain and decreased motion and decided to follow through with surgery. Pt reports prior history of R partial TKR in 2014    PT Comments    Pt in bed fatigued from bath but willing to participate.  Participated in exercises as described below.  She was able to ambulate around nursing unit with walker and supervision.  She did report some dizziness that did not worsen which she attributed to being hungry.  She stated she is not diabetic.  Tray arrived during session and she was set up for breakfast.  Declined stair training this am stating she did stairs well yesterday and also has a ramp into her home.  Anticipate discharge home today.   Follow Up Recommendations  Outpatient PT     Equipment Recommendations  None recommended by PT    Recommendations for Other Services       Precautions / Restrictions Precautions Precautions: Fall;Knee Restrictions Weight Bearing Restrictions: Yes LLE Weight Bearing: Weight bearing as tolerated Other Position/Activity Restrictions: Pt able to perform independent L SLR without lag, no KI donned to LLE    Mobility  Bed Mobility Overal bed mobility: Modified Independent Bed Mobility: Supine to Sit;Sit to Supine     Supine to sit: Supervision Sit to supine: Supervision   General bed mobility comments: Pt able to perform sup to/from sit without physical assistance but requires extra time and effort.  Transfers Overall transfer level: Modified independent Equipment used: Rolling walker (2 wheeled) Transfers: Sit to/from Stand Sit to Stand: Supervision         General transfer comment: Good safety and balance  Ambulation/Gait Ambulation/Gait assistance: Supervision Ambulation Distance (Feet): 200  Feet Assistive device: Rolling walker (2 wheeled) Gait Pattern/deviations: Decreased step length - right   Gait velocity interpretation: Below normal speed for age/gender General Gait Details: Slow cadence with gait but steady without LOB, activity tolerance progressing well   Stairs            Wheelchair Mobility    Modified Rankin (Stroke Patients Only)       Balance Overall balance assessment: Modified Independent Sitting-balance support: Feet supported Sitting balance-Leahy Scale: Good     Standing balance support: Bilateral upper extremity supported Standing balance-Leahy Scale: Good                      Cognition Arousal/Alertness: Awake/alert Behavior During Therapy: WFL for tasks assessed/performed Overall Cognitive Status: Within Functional Limits for tasks assessed                      Exercises Total Joint Exercises Ankle Circles/Pumps: AROM;10 reps;Both Quad Sets: AROM;10 reps;Both Gluteal Sets: AROM;10 reps;Both Short Arc Quad: Strengthening;Left;10 reps;Supine Heel Slides: Strengthening;Left;10 reps;Supine Hip ABduction/ADduction: AROM;Left;5 reps Straight Leg Raises: AROM;Left;5 reps Long Arc Quad: AROM;Left;10 reps;15 reps Goniometric ROM: 0-82/90 A/AAROM, no ext lag    General Comments        Pertinent Vitals/Pain Pain Assessment: 0-10 Pain Score: 5  Pain Descriptors / Indicators: Aching Pain Intervention(s): RN gave pain meds during session;Repositioned;Ice applied;Limited activity within patient's tolerance    Home Living                      Prior Function  PT Goals (current goals can now be found in the care plan section) Progress towards PT goals: Progressing toward goals    Frequency    BID      PT Plan Current plan remains appropriate    Co-evaluation             End of Session Equipment Utilized During Treatment: Gait belt Activity Tolerance: Patient tolerated treatment  well Patient left: in bed;with call bell/phone within reach;with bed alarm set     Time: 0825-0853 PT Time Calculation (min) (ACUTE ONLY): 28 min  Charges:  $Gait Training: 8-22 mins $Therapeutic Exercise: 8-22 mins                    G Codes:      Danielle DessSarah Constance Hackenberg 12/08/2016, 10:13 AM

## 2016-12-08 NOTE — Progress Notes (Signed)
Pt discharged home, I/s on polar care use and care,ted hose use/care ,chg dressing in 7 days and adm of xarelto qpm for 14 days. xarelto given prior to d/c d/t pharmacy issues. Pt left via w/c for home after 2 large stools

## 2016-12-08 NOTE — Discharge Summary (Signed)
Physician Discharge Summary  Patient ID: Nataya Bastedo MRN: [id number] DOB/AGE: 1941-08-07 76 y.o.  Admit date: 12/05/2016 Discharge date: 12/08/2016  Admission Diagnoses:  PRIMARY OSTEOARTHRITIS LEFT KNEE  Discharge Diagnoses: Patient Active Problem List   Diagnosis Date Noted  . S/P total knee arthroplasty 12/05/2016  . Lichen sclerosus 10/27/2015  . Vaginal atrophy 10/27/2015  . Unstable bladder 10/27/2015  . Adult BMI 30+ 10/01/2014  . Arthritis, degenerative 03/08/2014  . Asthma without status asthmaticus 02/28/2014  . B12 deficiency 02/28/2014  . Accumulation of fluid in tissues 02/28/2014  . Acid reflux 02/28/2014  . HLD (hyperlipidemia) 02/28/2014  . BP (high blood pressure) 02/28/2014  . Other allergic rhinitis 02/28/2014  . Psoriatic arthritis (HCC) 02/28/2014  Primary osteoarthritis of the left knee  Past Medical History:  Diagnosis Date  . Asthma   . Complication of anesthesia    difficulty waking up in 1980's   . COPD (chronic obstructive pulmonary disease) (HCC)   . Diverticula of colon   . GERD (gastroesophageal reflux disease)   . History of shingles   . Hypertension   . Increased BMI   . Lichen sclerosus   . Lower extremity edema   . Menopause   . Osteoarthritis   . Overactive bladder   . Psoriatic arthritis (HCC)   . Seasonal allergies   . Urinary incontinence    Transfusion: None   Consultants (if any):   Discharged Condition: Improved  Hospital Course: Meshia Rau is an 76 y.o. female who was admitted 12/05/2016 with a diagnosis of degenerative arthrosis of the left knee and went to the operating room on 12/05/2016 and underwent the above named procedures.    Surgeries: Procedure(s): COMPUTER ASSISTED TOTAL KNEE ARTHROPLASTY on 12/05/2016 Patient tolerated the surgery well. Taken to PACU where she was stabilized and then transferred to the orthopedic floor.  Started on Xarelto 10mg  every 24 hours. Foot pumps applied bilaterally at 80  mm. Heels elevated on bed with rolled towels. No evidence of DVT. Negative Homan. Physical therapy started on day #1 for gait training and transfer. OT started day #1 for ADL and assisted devices.  Patient's IV and Foley were removed on POD1.  Hemovac drain removed on POD2.  Implants: DePuy Attune size 5N posterior stabilized femoral component (cemented), size 4 rotating platform tibial component (cemented), 38 mm medialized dome patella (cemented), and a 8 mm stabilized rotating platform polyethylene insert.  She was given perioperative antibiotics:  Anti-infectives    Start     Dose/Rate Route Frequency Ordered Stop   12/05/16 1900  clindamycin (CLEOCIN) IVPB 600 mg     600 mg 100 mL/hr over 30 Minutes Intravenous Every 6 hours 12/05/16 1814 12/06/16 1342   12/05/16 1046  clindamycin (CLEOCIN) 900 MG/50ML IVPB    Comments:  Eli Hose: cabinet override      12/05/16 1046 12/05/16 1310   12/05/16 0255  clindamycin (CLEOCIN) IVPB 900 mg     900 mg 100 mL/hr over 30 Minutes Intravenous On call to O.R. 12/05/16 0255 12/05/16 1330    .  She was given sequential compression devices, early ambulation, and Xarelto for DVT prophylaxis.  She benefited maximally from the hospital stay and there were no complications.    Recent vital signs:  Vitals:   12/08/16 0419 12/08/16 0730  BP: (!) 131/53 135/65  Pulse: 73 81  Resp: 18 18  Temp: 97.9 F (36.6 C) 98.1 F (36.7 C)    Recent laboratory studies:  Lab Results  Component Value  Date   HGB 10.7 (L) 12/07/2016   HGB 12.1 12/06/2016   HGB 12.7 11/21/2016   Lab Results  Component Value Date   WBC 12.1 (H) 12/07/2016   PLT 177 12/07/2016   Lab Results  Component Value Date   INR 1.01 11/21/2016   Lab Results  Component Value Date   NA 138 12/07/2016   K 4.0 12/07/2016   CL 107 12/07/2016   CO2 26 12/07/2016   BUN 23 (H) 12/07/2016   CREATININE 0.93 12/07/2016   GLUCOSE 111 (H) 12/07/2016    Discharge Medications:    Allergies as of 12/08/2016      Reactions   Hylan G-f 20 Shortness Of Breath   Penicillins Swelling, Rash   Has patient had a PCN reaction causing immediate rash, facial/tongue/throat swelling, SOB or lightheadedness with hypotension: unknown Has patient had a PCN reaction causing severe rash involving mucus membranes or skin necrosis: No Has patient had a PCN reaction that required hospitalization No Has patient had a PCN reaction occurring within the last 10 years: No If all of the above answers are "NO", then may proceed with Cephalosporin use.      Medication List    TAKE these medications   acetaminophen 500 MG tablet Commonly known as:  TYLENOL Take 1,000 mg by mouth 2 (two) times daily.   aspirin 81 MG tablet Take 81 mg by mouth daily.   B-12 1000 MCG Caps Take 1 tablet by mouth daily.   calcium carbonate 1500 (600 Ca) MG Tabs tablet Commonly known as:  OSCAL Take 600 mg of elemental calcium by mouth daily with breakfast.   cetirizine 10 MG tablet Commonly known as:  ZYRTEC Take 10 mg by mouth at bedtime as needed for allergies.   clindamycin 150 MG capsule Commonly known as:  CLEOCIN once daily as needed (for teeth cleaning).   clobetasol ointment 0.05 % Commonly known as:  TEMOVATE Apply 1 application topically 2 (two) times daily. What changed:  when to take this   D3-1000 PO Take 1,000 Units by mouth daily.   DELSYM COUGH/CHEST CONGEST DM 5-100 MG/5ML Liqd Generic drug:  Dextromethorphan-Guaifenesin Take 1 Dose by mouth daily as needed.   estradiol 0.1 MG/GM vaginal cream Commonly known as:  ESTRACE Place 0.5 Applicatorfuls vaginally at bedtime. 1/2 gram twice a week   fluticasone 50 MCG/ACT nasal spray Commonly known as:  FLONASE Place 2 sprays into both nostrils daily as needed for allergies.   fluticasone-salmeterol 115-21 MCG/ACT inhaler Commonly known as:  ADVAIR HFA Inhale 2 puffs into the lungs 2 (two) times daily.   folic acid 1 MG  tablet Commonly known as:  FOLVITE Take 1 mg by mouth daily.   GNP MAGNESIUM 250 MG Tabs Generic drug:  Magnesium Take 250 mg by mouth daily.   hyoscyamine 0.125 MG SL tablet Commonly known as:  LEVSIN SL Place 0.125 mg under the tongue every 4 (four) hours as needed for cramping.   methotrexate 2.5 MG tablet Commonly known as:  RHEUMATREX Take 15 mg by mouth every 7 (seven) days. Caution:Chemotherapy. Protect from light.  6 tablets on Fridays   methyldopa 500 MG tablet Commonly known as:  ALDOMET Take 500 mg by mouth 2 (two) times daily.   montelukast 10 MG tablet Commonly known as:  SINGULAIR Take 10 mg by mouth at bedtime.   MULTI-VITAMINS Tabs Take 1 tablet by mouth daily. Senior Silver   naproxen sodium 220 MG tablet Commonly known as:  ANAPROX Take  220 mg by mouth 2 (two) times daily as needed.   omeprazole 40 MG capsule Commonly known as:  PRILOSEC Take 40 mg by mouth daily.   oxybutynin 5 MG 24 hr tablet Commonly known as:  DITROPAN-XL Take 1 tablet (5 mg total) by mouth at bedtime.   oxyCODONE 5 MG immediate release tablet Commonly known as:  Oxy IR/ROXICODONE Take 1-2 tablets (5-10 mg total) by mouth every 4 (four) hours as needed for severe pain or breakthrough pain.   potassium chloride 10 MEQ tablet Commonly known as:  K-DUR Take 10 mEq by mouth daily.   potassium gluconate 595 (99 K) MG Tabs tablet Take 595 mg by mouth daily.   PROAIR HFA 108 (90 Base) MCG/ACT inhaler Generic drug:  albuterol Inhale 2 puffs into the lungs every 4 (four) hours as needed for wheezing or shortness of breath.   PROBIOTIC PO Take 1 tablet by mouth daily.   ranitidine 300 MG tablet Commonly known as:  ZANTAC Take 300 mg by mouth at bedtime.   REMICADE 100 MG injection Generic drug:  inFLIXimab Inject into the vein every 6 (six) weeks. Every 6 weeks  Next is due 12-18-2016   rivaroxaban 10 MG Tabs tablet Commonly known as:  XARELTO Take 1 tablet (10 mg total)  by mouth daily with supper.   triamterene-hydrochlorothiazide 37.5-25 MG capsule Commonly known as:  DYAZIDE Take 1 capsule by mouth daily.   trolamine salicylate 10 % cream Commonly known as:  ASPERCREME Apply 1 application topically as needed for muscle pain.   vitamin E 400 UNIT capsule Generic drug:  vitamin E Take 400 Units by mouth daily.            Durable Medical Equipment        Start     Ordered   12/05/16 1814  DME Walker rolling  Once    Question:  Patient needs a walker to treat with the following condition  Answer:  Total knee replacement status   12/05/16 1814   12/05/16 1814  DME Bedside commode  Once    Question:  Patient needs a bedside commode to treat with the following condition  Answer:  Total knee replacement status   12/05/16 1814     Diagnostic Studies: Dg Knee Left Port  Result Date: 12/05/2016 CLINICAL DATA:  Knee pain. EXAM: PORTABLE LEFT KNEE - 1-2 VIEW COMPARISON:  None. FINDINGS: The patient is status post total knee arthroplasty. Satisfactory position alignment. Surgical drain good position. Prominent soft tissue swelling. IMPRESSION: Satisfactory appearance status post total knee arthroplasty. Electronically Signed   By: Elsie Stain M.D.   On: 12/05/2016 17:40   Disposition: Plan will be for discharge home today pending a BM.  Follow-up Information    WOLFE,JON R., PA Follow up in 12 day(s).   Specialty:  Physician Assistant Why:  Mindi Slicker information: [address] [facility] [place] [address] [phone]          Signed: Meriel Pica PA-C 12/08/2016, 8:35 AM

## 2016-12-08 NOTE — Discharge Instructions (Signed)

## 2017-03-06 ENCOUNTER — Other Ambulatory Visit: Payer: Self-pay | Admitting: Orthopedic Surgery

## 2017-03-06 DIAGNOSIS — M5410 Radiculopathy, site unspecified: Secondary | ICD-10-CM

## 2017-03-15 ENCOUNTER — Ambulatory Visit
Admission: RE | Admit: 2017-03-15 | Discharge: 2017-03-15 | Disposition: A | Discharge status: Home / Self Care | Payer: Medicare Other | Source: Ambulatory Visit | Attending: Orthopedic Surgery | Admitting: Orthopedic Surgery

## 2017-03-15 DIAGNOSIS — M2578 Osteophyte, vertebrae: Secondary | ICD-10-CM | POA: Diagnosis not present

## 2017-03-15 DIAGNOSIS — M5410 Radiculopathy, site unspecified: Secondary | ICD-10-CM

## 2017-03-15 DIAGNOSIS — M4696 Unspecified inflammatory spondylopathy, lumbar region: Secondary | ICD-10-CM | POA: Diagnosis not present

## 2017-03-15 DIAGNOSIS — M5136 Other intervertebral disc degeneration, lumbar region: Secondary | ICD-10-CM | POA: Diagnosis not present

## 2017-04-24 ENCOUNTER — Encounter: Payer: Medicare Other | Admitting: Obstetrics and Gynecology

## 2017-04-25 ENCOUNTER — Ambulatory Visit (INDEPENDENT_AMBULATORY_CARE_PROVIDER_SITE_OTHER): Payer: Medicare Other | Admitting: Obstetrics and Gynecology

## 2017-04-25 ENCOUNTER — Encounter: Payer: Self-pay | Admitting: Obstetrics and Gynecology

## 2017-04-25 VITALS — BP 163/75 | HR 77 | Ht 61.0 in | Wt 218.4 lb

## 2017-04-25 DIAGNOSIS — N952 Postmenopausal atrophic vaginitis: Secondary | ICD-10-CM | POA: Diagnosis not present

## 2017-04-25 DIAGNOSIS — N3289 Other specified disorders of bladder: Secondary | ICD-10-CM | POA: Diagnosis not present

## 2017-04-25 DIAGNOSIS — L900 Lichen sclerosus et atrophicus: Secondary | ICD-10-CM

## 2017-04-25 NOTE — Progress Notes (Signed)
Chief complaint: 1. Lichen sclerosus 2. Vaginal atrophy 3. Unstable bladder  6 month follow-up on above issues LICHEN SCLEROSIS: Intermittently taking the medication once or twice a week; flares typically occur when dosing is decreased. Patient is limiting medication application due to cost. VAGINAL ATROPHY: No significant symptoms; she continues to use the Estrace cream twice a week intravaginal. UNSTABLE BLADDER: Symptoms minimal on medication.  Past Medical History:  Diagnosis Date  . Asthma   . Complication of anesthesia    difficulty waking up in 1980's   . COPD (chronic obstructive pulmonary disease) (HCC)   . Diverticula of colon   . GERD (gastroesophageal reflux disease)   . History of shingles   . Hypertension   . Increased BMI   . Lichen sclerosus   . Lower extremity edema   . Menopause   . Osteoarthritis   . Overactive bladder   . Psoriatic arthritis (HCC)   . Seasonal allergies   . Urinary incontinence    Review of systems: Review of Systems  Constitutional: Negative.   Respiratory: Negative.   Cardiovascular: Negative.   Genitourinary:       Unstable bladder symptoms less on medication  Musculoskeletal:       Status post left knee replacement since last visit  Skin: Negative.   Neurological: Negative.   Endo/Heme/Allergies: Negative.   Psychiatric/Behavioral: Negative.    OBJECTIVE: BP (!) 163/75   Pulse 77   Ht 5\' 1"  (1.549 m)   Wt 218 lb 6.4 oz (99.1 kg)   BMI 41.27 kg/m  Pleasant female in no acute distress.  Abdomen: Soft, nontender Pelvic exam:  External Genitalia: Atrophic changes present; periclitoral leukoplakia and labia minora agglutination present.  Minimal leukoplakia noted at the posterior fourchette.  Single digit vaginal exam is notable for a narrowed introitus without palpable pelvic or adnexal mass. Speculum exam is not done             BUS: Normal             Vagina: Atrophic changes; no palpable masses or tenderness.  Cervix: Surgically absent             Uterus:Surgically absent             Adnexa: Normal             RV: External Exam NormaI  ASSESSMENT: 1. Lichen sclerosus, clinically stable 2. Vaginal atrophy, clinically stable 3. Unstable bladder, stable on medication  PLAN: 1. Continue Temovate ointment 0.05% topically twice weekly 2. Continue Estrace cream intravaginal twice weekly 3. Continue oxybutynin 5 mg daily 4. Return in 6 months for follow-up  A total of 15 minutes were spent face-to-face with the patient during this encounter and over half of that time dealt with counseling and coordination of care.  Herold HarmsMartin A Any Mcneice, MD  Note: This dictation was prepared with Dragon dictation along with smaller phrase technology. Any transcriptional errors that result from this process are unintentional.

## 2017-04-25 NOTE — Patient Instructions (Signed)
1. Continue with Temovate ointment to the vulva twice a week 2. Continue with Estrace cream intravaginal twice a week 3. Continue with oxybutynin daily 4. Return in 6 months for follow-up

## 2017-10-29 ENCOUNTER — Encounter: Payer: Medicare Other | Admitting: Obstetrics and Gynecology

## 2017-10-29 ENCOUNTER — Encounter: Payer: Self-pay | Admitting: Obstetrics and Gynecology

## 2017-10-29 ENCOUNTER — Ambulatory Visit (INDEPENDENT_AMBULATORY_CARE_PROVIDER_SITE_OTHER): Payer: Medicare Other | Admitting: Obstetrics and Gynecology

## 2017-10-29 VITALS — BP 154/81 | HR 65 | Ht 61.0 in | Wt 220.1 lb

## 2017-10-29 DIAGNOSIS — N952 Postmenopausal atrophic vaginitis: Secondary | ICD-10-CM

## 2017-10-29 DIAGNOSIS — N3289 Other specified disorders of bladder: Secondary | ICD-10-CM

## 2017-10-29 DIAGNOSIS — L900 Lichen sclerosus et atrophicus: Secondary | ICD-10-CM

## 2017-10-29 DIAGNOSIS — Z6379 Other stressful life events affecting family and household: Secondary | ICD-10-CM

## 2017-10-29 MED ORDER — OXYBUTYNIN CHLORIDE ER 5 MG PO TB24: 5.0000 mg | ORAL_TABLET | Freq: Every day | ORAL | 6 refills | Status: DC

## 2017-10-29 NOTE — Progress Notes (Signed)
Chief complaint: 1.  Lichen sclerosis 2.  Vaginal atrophy 3.  Unstable bladder 4.  Life stressors  3762-month follow-up: LICHEN SCLEROSIS: Using the Temovate ointment once or twice a week; no recent flares. VAGINAL ATROPHY: No significant symptoms; she is using the Estrace cream only once a week at this time UNSTABLE BLADDER: The patient reports no significant worsening of symptoms and is pleased with the medication including minimal side effects. LIFE STRESSORS: Granddaughter, age 75 recently diagnosed with aggressive breast cancer, currently undergoing radiation [place] in [place]; daughter has called back from abnormal mammogram with results pending.  Patient reports feeling somewhat anxious because of her children and grandchildren's health issues.  Past medical history, past surgical history, problem list, medications, and allergies are reviewed  OBJECTIVE: BP (!) 154/81   Pulse 65   Ht 5\' 1"  (1.549 m)   Wt 220 lb 1.6 oz (99.8 kg)   BMI 41.59 kg/m  External Genitalia: Atrophic changes present; no significant leukoplakia; labia minoraagglutination present. Minimal leukoplakia noted at the posterior fourchette. No epithelial skin breakdown. BUS: Normal Vagina: Not examined Rectovaginal: Chronic skin changes noted in the perianal region without evidence of leukoplakia.  ASSESSMENT: 1.  Lichen sclerosis, clinically stable 2.  Vaginal atrophy, stable 3.  Unstable bladder, with improvement using oxybutynin daily 4.  Family history of breast cancer with associated anxiety  PLAN: 1.  Continue with Temovate ointment 0.05% topically twice a week 2.  Continue with Estrace cream intravaginally once or twice a week 3.  Continue with oxybutynin 5 mg orally daily 4.  Return in 6 months for follow-up  A total of 15 minutes were spent face-to-face with the patient during this encounter and over half of that time dealt with counseling and coordination of care.  Herold HarmsMartin A  Timohty Renbarger, MD  Note: This dictation was prepared with Dragon dictation along with smaller phrase technology. Any transcriptional errors that result from this process are unintentional.

## 2017-10-29 NOTE — Patient Instructions (Signed)
1.  Continue with estrogen cream intravaginally once or twice a week 2.  Continue with Temovate ointment twice weekly to the vulva 3.  Continue with oxybutynin 5 mg daily 4.  Return in 6 months for follow-up

## 2018-04-30 ENCOUNTER — Encounter: Payer: Medicare Other | Admitting: Obstetrics and Gynecology

## 2018-05-08 ENCOUNTER — Ambulatory Visit (INDEPENDENT_AMBULATORY_CARE_PROVIDER_SITE_OTHER): Payer: Medicare Other | Admitting: Obstetrics and Gynecology

## 2018-05-08 ENCOUNTER — Encounter: Payer: Medicare Other | Admitting: Obstetrics and Gynecology

## 2018-05-08 ENCOUNTER — Encounter: Payer: Self-pay | Admitting: Obstetrics and Gynecology

## 2018-05-08 VITALS — BP 136/79 | HR 75 | Ht 61.0 in | Wt 218.1 lb

## 2018-05-08 DIAGNOSIS — L900 Lichen sclerosus et atrophicus: Secondary | ICD-10-CM

## 2018-05-08 DIAGNOSIS — N952 Postmenopausal atrophic vaginitis: Secondary | ICD-10-CM

## 2018-05-08 DIAGNOSIS — Z6379 Other stressful life events affecting family and household: Secondary | ICD-10-CM

## 2018-05-08 DIAGNOSIS — N3289 Other specified disorders of bladder: Secondary | ICD-10-CM

## 2018-05-08 MED ORDER — HYDROCORTISONE ACETATE 25 MG RE SUPP: RECTAL | 1 refills | Status: DC

## 2018-05-08 NOTE — Patient Instructions (Signed)
1.  Continue oxybutynin 5 mg every night 2.  Continue with Premarin cream intravaginal twice a week 3.  Begin Anusol HC suppositories 25 mg per vagina twice a week (alternating with the Premarin cream) 4.  Return in 6 weeks for follow-up 5.  [facility] referral is encouraged for managing life stressors due to chronic illness in husband and granddaughter

## 2018-05-08 NOTE — Progress Notes (Signed)
: 1.  Lichen sclerosis 2. 3.  Stable bladder 4.  Life stressors due to chronic illnesses associated with members  Patient presents for six-month follow-up.  LICHEN SCLEROSIS: Patient has been using Temovate ointment topically once or twice a week; use has been inconsistent due to cost the nature of the medication.  Symptoms tend to wax and wane and they include vulvar itching and burning.  Overall they appear to be clinically stable.  VAGINAL ATROPHY: Patient is using Estrace cream intravaginal once a week.  She is not noting any significant symptoms at this time.  UNSTABLE BLADDER: Patient states that her urgency symptoms are pretty much stable on the medication.  She is pleased with the medicine and does not have any significant side effects.  LIFE STRESSORS: Granddaughter, age 76, has aggressive breast cancer; radiation therapy has not been successful in controlling disease; currently she is taking chemotherapy; she is scheduled to have bilateral mastectomy with breast reconstruction active with chronic pain in your future. Husband has prostate cancer.  Anxiety is present regarding these health issues with family members.  She uses prayer to help manage her anxiety.  She has not had any counseling.  Past Medical History:  Diagnosis Date  . Asthma   . Complication of anesthesia    difficulty waking up in 1980's   . COPD (chronic obstructive pulmonary disease) (HCC)   . Diverticula of colon   . GERD (gastroesophageal reflux disease)   . History of shingles   . Hypertension   . Increased BMI   . Lichen sclerosus   . Lower extremity edema   . Menopause   . Osteoarthritis   . Overactive bladder   . Psoriatic arthritis (HCC)   . Seasonal allergies   . Urinary incontinence    Past Surgical History:  Procedure Laterality Date  . GANGLION CYST EXCISION    . KNEE ARTHROPLASTY Left 12/05/2016   Procedure: COMPUTER ASSISTED TOTAL KNEE ARTHROPLASTY;  Surgeon: Donato HeinzJames P Hooten, MD;   Location: [facility] ORS;  Service: Orthopedics;  Laterality: Left;  . knee replacement    . lower leg fracture    . TONSILLECTOMY    . VAGINAL HYSTERECTOMY    . VEIN SURGERY     Review of Systems  Constitutional: Negative.        Some weight gain due to anxious eating  Respiratory: Negative.   Cardiovascular: Negative.   Gastrointestinal: Negative.   Genitourinary: Positive for frequency.  Musculoskeletal: Negative.   Neurological: Negative.   Endo/Heme/Allergies: Negative.   Psychiatric/Behavioral: The patient is nervous/anxious.    OBJECTIVE: BP 136/79   Pulse 75   Ht 5\' 1"  (1.549 m)   Wt 218 lb 1.6 oz (98.9 kg)   BMI 41.21 kg/m  Pleasant female no acute distress.  Alert and oriented.  Affect is appropriate. Abdomen: Soft, nontender; moderate pannus; no hernias Pelvic exam: External genitalia-atrophic changes present; leukoplakia is noted on labia minora and at the introitus with some epithelial skin breakdown present.  Minimal perianal leukoplakia is noted at the 11 o'clock position BUS-normal Vagina-atrophic changes present; vaginal vault is narrowed; no palpable masses Rectovaginal-mild perianal hyperemia with leukoplakia at 11:00  ASSESSMENT: 1.  Lichen sclerosis, with occasional flares, with some noncompliance with clobetasol therapy due to cost of medication 2.  Vaginal atrophy, stable, asymptomatic 3.  Unstable bladder, with good symptom control using oxybutynin 5 mg daily 4.  Life stressors presents due to family members with cancer  PLAN: 1.  Pulse Temovate ointment at this  time for 6 weeks 2.  Continue Estrace cream intravaginally twice weekly 3.  Begin trial of Anusol HC suppositories 25 mg intravaginal twice a week (alternating with the Estrace cream 4.  Information regarding [facility] for stress management is given 5.  Return in 6 weeks for follow-up [email or web address]  Herold Harms, MD  Note: This dictation was prepared with Dragon dictation  along with smaller phrase technology. Any transcriptional errors that result from this process are unintentional.  .

## 2018-06-20 ENCOUNTER — Other Ambulatory Visit: Payer: Self-pay

## 2018-06-20 MED ORDER — OXYBUTYNIN CHLORIDE ER 5 MG PO TB24: 5.0000 mg | ORAL_TABLET | Freq: Every day | ORAL | 6 refills | Status: DC

## 2018-06-23 ENCOUNTER — Ambulatory Visit
Admission: RE | Admit: 2018-06-23 | Discharge: 2018-06-23 | Disposition: A | Discharge status: Home / Self Care | Payer: Medicare Other | Source: Ambulatory Visit | Attending: Unknown Physician Specialty | Admitting: Unknown Physician Specialty

## 2018-06-23 ENCOUNTER — Ambulatory Visit: Payer: Medicare Other | Admitting: Anesthesiology

## 2018-06-23 ENCOUNTER — Encounter
Admission: RE | Disposition: A | Discharge status: Home / Self Care | Payer: Self-pay | Source: Ambulatory Visit | Attending: Unknown Physician Specialty

## 2018-06-23 DIAGNOSIS — J45909 Unspecified asthma, uncomplicated: Secondary | ICD-10-CM | POA: Diagnosis not present

## 2018-06-23 DIAGNOSIS — K640 First degree hemorrhoids: Secondary | ICD-10-CM | POA: Diagnosis not present

## 2018-06-23 DIAGNOSIS — Z8371 Family history of colonic polyps: Secondary | ICD-10-CM | POA: Insufficient documentation

## 2018-06-23 DIAGNOSIS — Z87891 Personal history of nicotine dependence: Secondary | ICD-10-CM | POA: Diagnosis not present

## 2018-06-23 DIAGNOSIS — K219 Gastro-esophageal reflux disease without esophagitis: Secondary | ICD-10-CM | POA: Diagnosis not present

## 2018-06-23 DIAGNOSIS — J449 Chronic obstructive pulmonary disease, unspecified: Secondary | ICD-10-CM | POA: Insufficient documentation

## 2018-06-23 DIAGNOSIS — Z79899 Other long term (current) drug therapy: Secondary | ICD-10-CM | POA: Insufficient documentation

## 2018-06-23 DIAGNOSIS — K5730 Diverticulosis of large intestine without perforation or abscess without bleeding: Secondary | ICD-10-CM | POA: Insufficient documentation

## 2018-06-23 DIAGNOSIS — Z7982 Long term (current) use of aspirin: Secondary | ICD-10-CM | POA: Insufficient documentation

## 2018-06-23 DIAGNOSIS — Z1211 Encounter for screening for malignant neoplasm of colon: Secondary | ICD-10-CM | POA: Insufficient documentation

## 2018-06-23 DIAGNOSIS — N3281 Overactive bladder: Secondary | ICD-10-CM | POA: Diagnosis not present

## 2018-06-23 DIAGNOSIS — I10 Essential (primary) hypertension: Secondary | ICD-10-CM | POA: Insufficient documentation

## 2018-06-23 HISTORY — PX: COLONOSCOPY WITH PROPOFOL: SHX5780

## 2018-06-23 SURGERY — COLONOSCOPY WITH PROPOFOL
Anesthesia: General

## 2018-06-23 MED ORDER — PROPOFOL 500 MG/50ML IV EMUL
INTRAVENOUS | Status: AC
Start: 2018-06-23 — End: 2018-06-23
  Filled 2018-06-23: qty 50

## 2018-06-23 MED ORDER — MIDAZOLAM HCL 2 MG/2ML IJ SOLN
INTRAMUSCULAR | Status: AC
  Filled 2018-06-23: qty 2

## 2018-06-23 MED ORDER — FENTANYL CITRATE (PF) 100 MCG/2ML IJ SOLN
INTRAMUSCULAR | Status: AC
  Filled 2018-06-23: qty 2

## 2018-06-23 MED ORDER — FENTANYL CITRATE (PF) 100 MCG/2ML IJ SOLN
INTRAMUSCULAR | Status: DC | PRN
  Administered 2018-06-23 (×2): 25 ug via INTRAVENOUS
  Administered 2018-06-23: 50 ug via INTRAVENOUS

## 2018-06-23 MED ORDER — MIDAZOLAM HCL 5 MG/5ML IJ SOLN
INTRAMUSCULAR | Status: DC | PRN
  Administered 2018-06-23: 2 mg via INTRAVENOUS

## 2018-06-23 MED ORDER — PROPOFOL 10 MG/ML IV BOLUS
INTRAVENOUS | Status: DC | PRN
  Administered 2018-06-23: 30 mg via INTRAVENOUS

## 2018-06-23 MED ORDER — PROPOFOL 500 MG/50ML IV EMUL
INTRAVENOUS | Status: DC | PRN
  Administered 2018-06-23: 30 ug/kg/min via INTRAVENOUS

## 2018-06-23 MED ORDER — VANCOMYCIN HCL IN DEXTROSE 1-5 GM/200ML-% IV SOLN
INTRAVENOUS | Status: AC
  Filled 2018-06-23: qty 200

## 2018-06-23 MED ORDER — LIDOCAINE HCL (PF) 2 % IJ SOLN
INTRAMUSCULAR | Status: DC | PRN
  Administered 2018-06-23: 80 mg

## 2018-06-23 MED ORDER — VANCOMYCIN HCL IN DEXTROSE 1-5 GM/200ML-% IV SOLN
INTRAVENOUS | Status: AC
  Administered 2018-06-23: 1000 mg via INTRAVENOUS
  Filled 2018-06-23: qty 200

## 2018-06-23 MED ORDER — SODIUM CHLORIDE 0.9 % IV SOLN
INTRAVENOUS | Status: DC
  Administered 2018-06-23: 1000 mL via INTRAVENOUS

## 2018-06-23 MED ORDER — VANCOMYCIN HCL IN DEXTROSE 1-5 GM/200ML-% IV SOLN
1000.0000 mg | Freq: Once | INTRAVENOUS | Status: AC
  Administered 2018-06-23: 1000 mg via INTRAVENOUS

## 2018-06-23 MED ORDER — LIDOCAINE HCL (PF) 2 % IJ SOLN
INTRAMUSCULAR | Status: AC
  Filled 2018-06-23: qty 10

## 2018-06-23 MED ORDER — SODIUM CHLORIDE 0.9 % IV SOLN
Freq: Once | INTRAVENOUS | Status: AC
  Administered 2018-06-23: 10:00:00 via INTRAVENOUS
  Filled 2018-06-23: qty 2.5

## 2018-06-23 MED ORDER — GENTAMICIN IN SALINE 1-0.9 MG/ML-% IV SOLN: 100.0000 mg | Freq: Once | INTRAVENOUS | Status: DC

## 2018-06-23 MED ORDER — SODIUM CHLORIDE 0.9 % IV SOLN: INTRAVENOUS | Status: DC

## 2018-06-23 NOTE — Anesthesia Preprocedure Evaluation (Signed)
Anesthesia Evaluation  Patient identified by MRN, date of birth, ID band Patient awake    Reviewed: Allergy & Precautions, H&P , NPO status , Patient's Chart, lab work & pertinent test results, reviewed documented beta blocker date and time   History of Anesthesia Complications (+) history of anesthetic complications  Airway Mallampati: II   Neck ROM: full    Dental  (+) Poor Dentition, Teeth Intact   Pulmonary neg pulmonary ROS, asthma , COPD, former smoker,    Pulmonary exam normal        Cardiovascular Exercise Tolerance: Poor hypertension, On Medications negative cardio ROS Normal cardiovascular exam Rhythm:regular Rate:Normal     Neuro/Psych  Neuromuscular disease negative neurological ROS  negative psych ROS   GI/Hepatic negative GI ROS, Neg liver ROS, GERD  Medicated,  Endo/Other  negative endocrine ROS  Renal/GU negative Renal ROS  negative genitourinary   Musculoskeletal   Abdominal   Peds  Hematology negative hematology ROS (+)   Anesthesia Other Findings Past Medical History: No date: Asthma No date: Complication of anesthesia     Comment:  difficulty waking up in 1980's  No date: COPD (chronic obstructive pulmonary disease) (HCC) No date: Diverticula of colon No date: GERD (gastroesophageal reflux disease) No date: History of shingles No date: Hypertension No date: Increased BMI No date: Lichen sclerosus No date: Lower extremity edema No date: Menopause No date: Osteoarthritis No date: Overactive bladder No date: Psoriatic arthritis (HCC) No date: Seasonal allergies No date: Urinary incontinence Past Surgical History: No date: FRACTURE SURGERY No date: GANGLION CYST EXCISION No date: JOINT REPLACEMENT 12/05/2016: KNEE ARTHROPLASTY; Left     Comment:  Procedure: COMPUTER ASSISTED TOTAL KNEE ARTHROPLASTY;                Surgeon: Donato HeinzJames P Hooten, MD;  Location: [facility] ORS;    Service: Orthopedics;  Laterality: Left; No date: knee replacement No date: lower leg fracture No date: TONSILLECTOMY No date: VAGINAL HYSTERECTOMY No date: VEIN SURGERY   Reproductive/Obstetrics negative OB ROS                             Anesthesia Physical Anesthesia Plan  ASA: III  Anesthesia Plan: General   Post-op Pain Management:    Induction:   PONV Risk Score and Plan:   Airway Management Planned:   Additional Equipment:   Intra-op Plan:   Post-operative Plan:   Informed Consent: I have reviewed the patients History and Physical, chart, labs and discussed the procedure including the risks, benefits and alternatives for the proposed anesthesia with the patient or authorized representative who has indicated his/her understanding and acceptance.   Dental Advisory Given  Plan Discussed with: CRNA  Anesthesia Plan Comments:         Anesthesia Quick Evaluation

## 2018-06-23 NOTE — Transfer of Care (Signed)
Immediate Anesthesia Transfer of Care Note  Patient: Emily Fox  Procedure(s) Performed: COLONOSCOPY WITH PROPOFOL (N/A )  Patient Location: PACU  Anesthesia Type:General  Level of Consciousness: sedated  Airway & Oxygen Therapy: Patient Spontanous Breathing and Patient connected to nasal cannula oxygen  Post-op Assessment: Report given to RN and Post -op Vital signs reviewed and stable  Post vital signs: Reviewed and stable  Last Vitals:  Vitals Value Taken Time  BP 98/72 06/23/2018 11:31 AM  Temp 36.1 C 06/23/2018 11:31 AM  Pulse 65 06/23/2018 11:32 AM  Resp 24 06/23/2018 11:32 AM  SpO2 99 % 06/23/2018 11:32 AM  Vitals shown include unvalidated device data.  Last Pain:  Vitals:   06/23/18 1131  TempSrc: Tympanic  PainSc: 0-No pain         Complications: No apparent anesthesia complications

## 2018-06-23 NOTE — H&P (Signed)
Primary Care Physician:  Lauro Regulus, MD Primary Gastroenterologist:  Dr. Mechele Collin  Pre-Procedure History & Physical: HPI:  Emily Fox is a 77 y.o. female is here for an colonoscopy.  Done for FH colon polyps.   Past Medical History:  Diagnosis Date  . Asthma   . Complication of anesthesia    difficulty waking up in 1980's   . COPD (chronic obstructive pulmonary disease) (HCC)   . Diverticula of colon   . GERD (gastroesophageal reflux disease)   . History of shingles   . Hypertension   . Increased BMI   . Lichen sclerosus   . Lower extremity edema   . Menopause   . Osteoarthritis   . Overactive bladder   . Psoriatic arthritis (HCC)   . Seasonal allergies   . Urinary incontinence     Past Surgical History:  Procedure Laterality Date  . FRACTURE SURGERY    . GANGLION CYST EXCISION    . JOINT REPLACEMENT    . KNEE ARTHROPLASTY Left 12/05/2016   Procedure: COMPUTER ASSISTED TOTAL KNEE ARTHROPLASTY;  Surgeon: Donato Heinz, MD;  Location: [facility] ORS;  Service: Orthopedics;  Laterality: Left;  . knee replacement    . lower leg fracture    . TONSILLECTOMY    . VAGINAL HYSTERECTOMY    . VEIN SURGERY      Prior to Admission medications   Medication Sig Start Date End Date Taking? Authorizing Provider  acetaminophen (TYLENOL) 500 MG tablet Take 1,000 mg by mouth 2 (two) times daily.   Yes [provider]  albuterol (PROAIR HFA) 108 (90 BASE) MCG/ACT inhaler Inhale 2 puffs into the lungs every 4 (four) hours as needed for wheezing or shortness of breath.  07/28/15  Yes [provider]  aspirin 81 MG tablet Take 81 mg by mouth daily.   Yes [provider]  calcium carbonate (OSCAL) 1500 (600 Ca) MG TABS tablet Take 600 mg of elemental calcium by mouth daily with breakfast.   Yes [provider]  carvedilol (COREG) 6.25 MG tablet Take by mouth. 11/04/17 11/04/18 Yes [provider]  cetirizine (ZYRTEC) 10 MG tablet Take 10  mg by mouth at bedtime as needed for allergies.    Yes [provider]  Cholecalciferol (D3-1000 PO) Take 1,000 Units by mouth daily.   Yes [provider]  clobetasol ointment (TEMOVATE) 0.05 % Apply 1 application topically 2 (two) times daily. Patient taking differently: Apply 1 application topically daily.  10/24/16  Yes Defrancesco, Prentice Docker, MD  Cyanocobalamin (B-12) 1000 MCG CAPS Take 1 tablet by mouth daily.   Yes [provider]  diclofenac sodium (VOLTAREN) 1 % GEL Apply topically. 02/11/18 02/11/19 Yes [provider]  estradiol (ESTRACE) 0.1 MG/GM vaginal cream Place 0.5 Applicatorfuls vaginally at bedtime. 1/2 gram twice a week 10/26/15  Yes Defrancesco, Prentice Docker, MD  fluticasone (FLONASE) 50 MCG/ACT nasal spray Place 2 sprays into both nostrils daily as needed for allergies.    Yes [provider]  fluticasone-salmeterol (ADVAIR HFA) 115-21 MCG/ACT inhaler Inhale 2 puffs into the lungs 2 (two) times daily.   Yes [provider]  folic acid (FOLVITE) 1 MG tablet Take 1 mg by mouth daily.   Yes [provider]  furosemide (LASIX) 40 MG tablet Take by mouth. 05/06/18  Yes [provider]  hydrocortisone (ANUSOL-HC) 25 MG suppository 1 suppository intravaginal twice a week 05/08/18  Yes Defrancesco, Prentice Docker, MD  hyoscyamine (LEVSIN SL) 0.125 MG SL  tablet Place 0.125 mg under the tongue every 4 (four) hours as needed for cramping.   Yes [provider]  inFLIXimab (REMICADE) 100 MG injection Inject into the vein every 6 (six) weeks. Every 6 weeks  Next is due 12-18-2016   Yes [provider]  Magnesium (GNP MAGNESIUM) 250 MG TABS Take 250 mg by mouth daily.   Yes [provider]  methotrexate (RHEUMATREX) 2.5 MG tablet Take 15 mg by mouth every 7 (seven) days. Caution:Chemotherapy. Protect from light.  6 tablets on Fridays   Yes [provider]  montelukast (SINGULAIR) 10 MG tablet Take 10  mg by mouth at bedtime.   Yes [provider]  Multiple Vitamin (MULTI-VITAMINS) TABS Take 1 tablet by mouth daily. Senior Silver   Yes [provider]  omeprazole (PRILOSEC) 40 MG capsule Take 40 mg by mouth daily.   Yes [provider]  oxybutynin (DITROPAN-XL) 5 MG 24 hr tablet Take 1 tablet (5 mg total) by mouth at bedtime. 06/20/18  Yes Defrancesco, Prentice Docker, MD  potassium chloride (K-DUR) 10 MEQ tablet Take 10 mEq by mouth daily.    Yes [provider]  Probiotic Product (PROBIOTIC PO) Take 1 tablet by mouth daily.   Yes [provider]  ranitidine (ZANTAC) 300 MG tablet Take 300 mg by mouth at bedtime.  03/23/16  Yes [provider]  triamterene-hydrochlorothiazide (DYAZIDE) 37.5-25 MG capsule Take 1 capsule by mouth daily.   Yes [provider]  trolamine salicylate (ASPERCREME) 10 % cream Apply 1 application topically as needed for muscle pain.   Yes [provider]  vitamin E (VITAMIN E) 400 UNIT capsule Take 400 Units by mouth daily.   Yes [provider]  clindamycin (CLEOCIN) 150 MG capsule once daily as needed (for teeth cleaning).  03/07/15   [provider]  gabapentin (NEURONTIN) 300 MG capsule Take by mouth. 04/08/17 05/08/18  [provider]  naproxen sodium (ANAPROX) 220 MG tablet Take 220 mg by mouth 2 (two) times daily as needed.    [provider]  rivaroxaban (XARELTO) 10 MG TABS tablet Take 1 tablet (10 mg total) by mouth daily with supper. Patient not taking: Reported on 06/23/2018 12/08/16   Anson Oregon, PA-C  topiramate (TOPAMAX) 25 MG tablet Take by mouth. 10/17/17 10/17/18  [provider]    Allergies as of 03/31/2018 - Review Complete 10/29/2017  Allergen Reaction Noted  . Hylan g-f 20 Shortness Of Breath 10/26/2015  . Penicillins Swelling and Rash 10/25/2015    Family History  Problem Relation Age of Onset  . Heart disease Mother   . Diabetes  Mother   . Cancer Neg Hx   . Ovarian cancer Neg Hx     Social History   Socioeconomic History  . Marital status: Married    Spouse name: Not on file  . Number of children: Not on file  . Years of education: Not on file  . Highest education level: Not on file  Occupational History  . Not on file  Social Needs  . Financial resource strain: Not on file  . Food insecurity:    Worry: Not on file    Inability: Not on file  . Transportation needs:    Medical: Not on file    Non-medical: Not on file  Tobacco Use  . Smoking status: Former Smoker    Last attempt to quit: 11/21/1968    Years since quitting: 49.6  . Smokeless tobacco: Never Used  Substance and Sexual Activity  . Alcohol use: No  . Drug use: No  . Sexual activity: Not Currently  Lifestyle  . Physical activity:    Days per week: Not on file    Minutes per session: Not on file  . Stress: Not on file  Relationships  . Social connections:    Talks on phone: Not on file    Gets together: Not on file    Attends religious service: Not on file    Active member of club or organization: Not on file    Attends meetings of clubs or organizations: Not on file    Relationship status: Not on file  . Intimate partner violence:    Fear of current or ex partner: Not on file    Emotionally abused: Not on file    Physically abused: Not on file    Forced sexual activity: Not on file  Other Topics Concern  . Not on file  Social History Narrative  . Not on file    Review of Systems: See HPI, otherwise negative ROS  Physical Exam: BP (!) 154/82   Pulse 67   Temp (!) 96.9 F (36.1 C) (Tympanic)   Resp 18   Ht 5\' 3"  (1.6 m)   Wt 101.2 kg (223 lb)   SpO2 96%   BMI 39.50 kg/m  General:   Alert,  pleasant and cooperative in NAD Head:  Normocephalic and atraumatic. Neck:  Supple; no masses or thyromegaly. Lungs:  Clear throughout to auscultation.    Heart:  Regular rate and rhythm. Abdomen:  Soft, nontender and  nondistended. Normal bowel sounds, without guarding, and without rebound.   Neurologic:  Alert and  oriented x4;  grossly normal neurologically.  Impression/Plan: Emily Fox is here for an colonoscopy to [facility].  Risks, benefits, limitations, and alternatives regarding  colonoscopy have been reviewed with the patient.  Questions have been answered.  All parties agreeable.   Lynnae PrudeELLIOTT, Adilee Lemme, MD  06/23/2018, 11:07 AM

## 2018-06-23 NOTE — Op Note (Signed)
[facility] Gastroenterology Patient Name: Emily Fox Procedure Date: 06/23/2018 11:05 AM MRN: [id number] Account #: [id number] Date of Birth: [birth year] Admit Type: Outpatient Age: [id number] [facility] Gender: Female Note Status: Finalized Procedure:            Colonoscopy Indications:          Colon cancer screening in patient at increased risk:                        Family history of 1st-degree relative with colon polyps Providers:            Scot Junobert T. Elliott, MD Referring MD:         Marya AmslerMarshall W. Dareen PianoAnderson MD, MD (Referring MD) Medicines:            Propofol per Anesthesia Complications:        No immediate complications. Procedure:            Pre-Anesthesia Assessment:                       - After reviewing the risks and benefits, the patient                        was deemed in satisfactory condition to undergo the                        procedure.                       After obtaining informed consent, the colonoscope was                        passed under direct vision. Throughout the procedure,                        the patient's blood pressure, pulse, and oxygen                        saturations were monitored continuously. The                        Colonoscope was introduced through the anus and                        advanced to the the cecum, identified by appendiceal                        orifice and ileocecal valve. The patient tolerated the                        procedure well. The quality of the bowel preparation                        was excellent. Findings:      Internal hemorrhoids were found during endoscopy. The hemorrhoids were       small and Grade I (internal hemorrhoids that do not prolapse).      A few small-mouthed diverticula were found in the sigmoid colon.      The exam was otherwise without abnormality. Impression:           - Internal hemorrhoids.                       -  Diverticulosis in the sigmoid colon.                   - The examination was otherwise normal.                       - No specimens collected. Recommendation:       - Repeat colonoscopy in 5 years for surveillance. Scot Jun, MD 06/23/2018 11:30:09 AM This report has been signed electronically. Number of Addenda: 0 Note Initiated On: 06/23/2018 11:05 AM Scope Withdrawal Time: 0 hours 6 minutes 58 seconds  Total Procedure Duration: 0 hours 12 minutes 53 seconds       [facility]

## 2018-06-23 NOTE — Anesthesia Post-op Follow-up Note (Signed)
Anesthesia QCDR form completed.        

## 2018-06-24 ENCOUNTER — Encounter: Payer: Self-pay | Admitting: Unknown Physician Specialty

## 2018-06-24 NOTE — Anesthesia Postprocedure Evaluation (Signed)
Anesthesia Post Note  Patient: Emily Fox  Procedure(s) Performed: COLONOSCOPY WITH PROPOFOL (N/A )  Patient location during evaluation: PACU Anesthesia Type: General Level of consciousness: awake and alert Pain management: pain level controlled Vital Signs Assessment: post-procedure vital signs reviewed and stable Respiratory status: spontaneous breathing, nonlabored ventilation, respiratory function stable and patient connected to nasal cannula oxygen Cardiovascular status: blood pressure returned to baseline and stable Postop Assessment: no apparent nausea or vomiting Anesthetic complications: no     Last Vitals:  Vitals:   06/23/18 1200 06/23/18 1210  BP: 131/75 127/65  Pulse: 63 66  Resp: 20 19  Temp:    SpO2: 100% 100%    Last Pain:  Vitals:   06/24/18 0812  TempSrc:   PainSc: 0-No pain                 Yevette EdwardsJames G Adams

## 2018-06-30 ENCOUNTER — Other Ambulatory Visit: Payer: Self-pay | Admitting: Internal Medicine

## 2018-06-30 DIAGNOSIS — R600 Localized edema: Secondary | ICD-10-CM

## 2018-07-01 ENCOUNTER — Ambulatory Visit
Admission: RE | Admit: 2018-07-01 | Discharge: 2018-07-01 | Disposition: A | Discharge status: Home / Self Care | Payer: Medicare Other | Source: Ambulatory Visit | Attending: Internal Medicine | Admitting: Internal Medicine

## 2018-07-01 DIAGNOSIS — R600 Localized edema: Secondary | ICD-10-CM | POA: Insufficient documentation

## 2018-07-02 ENCOUNTER — Ambulatory Visit (INDEPENDENT_AMBULATORY_CARE_PROVIDER_SITE_OTHER): Payer: Medicare Other | Admitting: Obstetrics and Gynecology

## 2018-07-02 ENCOUNTER — Encounter: Payer: Self-pay | Admitting: Obstetrics and Gynecology

## 2018-07-02 VITALS — BP 146/82 | HR 75 | Ht 63.0 in | Wt 227.2 lb

## 2018-07-02 DIAGNOSIS — N9089 Other specified noninflammatory disorders of vulva and perineum: Secondary | ICD-10-CM | POA: Diagnosis not present

## 2018-07-02 DIAGNOSIS — L900 Lichen sclerosus et atrophicus: Secondary | ICD-10-CM

## 2018-07-02 MED ORDER — HYDROCORTISONE ACETATE 25 MG RE SUPP: RECTAL | 1 refills | Status: DC

## 2018-07-02 NOTE — Patient Instructions (Signed)
1.  Continue using the Anusol HC suppositories every other day for 6 weeks-intravaginal 2.  Return in 6 weeks for follow-up and possible biopsy

## 2018-07-02 NOTE — Progress Notes (Signed)
Chief complaint: 1.  Lichen sclerosus 2.  Unstable bladder  Patient presents for 6-week follow-up on lichen sclerosus.  Temovate ointment was discontinued at last visit.  [facility] 25 mg twice weekly intravaginal.  Patient states that her symptomatology has been stable-she cannot determine at this point if this is better than when she previously was using the Temovate ointment.  However, she does experience occasional vulvar burning.  She reports no vaginal bleeding or discharge.  She is status post recent colonoscopy (-).  She did have difficulty with her prep. Unstable bladder is stable and she long as she takes the oxybutynin 5 mg daily  Past medical history, past surgical history, problem list, medications, and allergies are reviewed  Review of systems-complete review of systems is negative except for that noted in the HPI  OBJECTIVE: BP (!) 146/82   Pulse 75   Ht 5\' 3"  (1.6 m)   Wt 227 lb 3.2 oz (103.1 kg)   BMI 40.25 kg/m  Pleasant elderly female in no distress.  Normal affect.  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  Previously noted epithelial skin breakdown is resolved.  There is a small peri-lunar area of leukoplakia, heaped up (pearly) noted at 11:00 BUS-normal Vagina-atrophic changes present; palpation with single digit reveals no mass or tenderness Rectovaginal-no significant perianal hyperemia  ASSESSMENT: 1.  Lichen sclerosis with apparent improved findings on clinical exam since transition to [facility] suppositories 2.  Vaginal atrophy, asymptomatic 3.  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  Unstable bladder with reasonable control with oxybutynin 5 mg daily.  PLAN: 1.  Discontinue the  Temovate as previously done at last visit 2.  Continue Anusol HC suppositories intravaginal twice weekly 3.  Continue using Estrace cream intravaginal twice weekly 4.  Return in 6 weeks for follow-up and possible vulvar biopsy  A total of 15 minutes were spent face-to-face with the patient during this encounter and over half of that time dealt with counseling and coordination of care.  Herold HarmsMartin A Defrancesco, MD  Note: This dictation was prepared with Dragon dictation along with smaller phrase technology. Any transcriptional errors that result from this process are unintentional.

## 2018-07-28 ENCOUNTER — Telehealth: Payer: Self-pay | Admitting: Obstetrics and Gynecology

## 2018-07-28 NOTE — Telephone Encounter (Signed)
The patient called and stated that she would like to speak with Dr. Drexel Iha nurse if possible. No other information was disclosed. Please advise.

## 2018-07-30 ENCOUNTER — Telehealth: Payer: Self-pay | Admitting: Obstetrics and Gynecology

## 2018-07-30 NOTE — Telephone Encounter (Signed)
Pt is requesting-before her Vuvlar bx.   Halcion(triazolam) 0.25mg  1 tab at night 1 tab prior to procedure.  pls advise.

## 2018-07-30 NOTE — Telephone Encounter (Signed)
The patient called Emily Fox back and would like another call back if possible. Please advise.

## 2018-07-30 NOTE — Telephone Encounter (Signed)
LMTRC

## 2018-07-30 NOTE — Telephone Encounter (Signed)
Pt is requesting medication to put her to sleep during her nv. She states a vulvar bx is very painful. Her dentist gives her medication to take during procedures that puts her out. She is requesting that med. Advised pt to get the name of med and we will ask MAD.

## 2018-08-07 MED ORDER — LORAZEPAM 1 MG PO TABS: 1.0000 mg | ORAL_TABLET | Freq: Once | ORAL | 0 refills | Status: AC

## 2018-08-07 NOTE — Addendum Note (Signed)
Addended by: Marchelle FolksMILLER, Wynell Halberg G on: 08/07/2018 05:08 PM   Modules accepted: Orders

## 2018-08-08 NOTE — Telephone Encounter (Signed)
LMTRC  Ativan phoned in.

## 2018-08-10 NOTE — Telephone Encounter (Signed)
Pt aware med. Pt advised to take 30 minutes prior to appt,

## 2018-08-13 ENCOUNTER — Encounter: Payer: Medicare Other | Admitting: Obstetrics and Gynecology

## 2018-08-19 ENCOUNTER — Encounter: Payer: Self-pay | Admitting: Obstetrics and Gynecology

## 2018-08-19 ENCOUNTER — Ambulatory Visit (INDEPENDENT_AMBULATORY_CARE_PROVIDER_SITE_OTHER): Payer: Medicare Other | Admitting: Obstetrics and Gynecology

## 2018-08-19 ENCOUNTER — Other Ambulatory Visit (HOSPITAL_COMMUNITY)
Admission: RE | Admit: 2018-08-19 | Discharge: 2018-08-19 | Disposition: A | Discharge status: Home / Self Care | Payer: Medicare Other | Source: Ambulatory Visit | Attending: Obstetrics and Gynecology | Admitting: Obstetrics and Gynecology

## 2018-08-19 VITALS — BP 131/74 | HR 80 | Ht 63.0 in | Wt 222.9 lb

## 2018-08-19 DIAGNOSIS — N9089 Other specified noninflammatory disorders of vulva and perineum: Secondary | ICD-10-CM

## 2018-08-19 DIAGNOSIS — N904 Leukoplakia of vulva: Secondary | ICD-10-CM | POA: Diagnosis not present

## 2018-08-19 NOTE — Progress Notes (Signed)
Chief complaint: 1.  Vulvar lesion 2.  Vulvar biopsy  Patient presents for perianal biopsy.  She has history of lichen sclerosus long-term and most recently had newly identified changes including a 3 mm pearly nodule located at 1:00 in the perianal region. 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  OBJECTIVE: BP 131/74   Pulse 80   Ht 5\' 3"  (1.6 m)   Wt 222 lb 14.4 oz (101.1 kg)   BMI 39.48 kg/m  Pleasant elderly sedated female in no acute distress.  Awake and oriented.  Daughter at her side. Pelvic: External genitalia-atrophic changes; no obvious leukoplakia along the labia minora; no epithelial skin breakdown Rectovaginal-no significant perianal hyperemia; pearly nodule/leukoplakia lesion 3 to 4 mm noted at the 1 o'clock position of anus.  PROCEDURE: Perianal biopsy Consent is obtained.  Patient is placed in dorsolithotomy position.  The perianal region is cleansed with Betadine.  1% lidocaine without epinephrine is injected for local anesthetic (3 cc utilized) 4 mm punch biopsy is taken at the 1:00 region encompassing the perianal nodule.  Specimen is sent to pathology.  Silver nitrate is applied topically for hemostasis.  Procedure was well-tolerated.  Blood loss is minimal.  ASSESSMENT: 1.  Long history of lichen sclerosus, vulvar symptoms stable 2.  Pearly white nodule at 1:00 of perianal region, biopsied  PLAN: 1.  Biopsy of perianal lesion as noted 2.  Postprocedure instructions are given 3.  Return in 2 weeks for follow-up and further management planning  Herold Harms, MD  Note: This dictation was prepared with Dragon dictation along with smaller phrase technology. Any transcriptional errors that result from this process are unintentional.

## 2018-08-19 NOTE — Patient Instructions (Signed)
1.  Vulvar biopsy is performed today-perianal region 2.  Return in 2 weeks for follow-up  VULVAR BIOPSY POST-PROCEDURE INSTRUCTIONS  1. You may take Ibuprofen, Aleve or Tylenol for pain if needed.    2. You may have a small amount of spotting.  You should wear a mini pad for the next few days.  3. You may use some topical Neosporin ointment if you would like (over the counter is fine).  4. You need to call if you have redness around the biopsy site, if there is any unusual draining, if the bleeding is heavy, or if you are concerned.  5. Shower or bathe as normal.  You may use Desitin ointment for protecting the biopsy site.  6. Return in 2 weeks for follow-up on biopsy and further management planning

## 2018-09-03 ENCOUNTER — Encounter: Payer: Self-pay | Admitting: Obstetrics and Gynecology

## 2018-09-03 ENCOUNTER — Ambulatory Visit (INDEPENDENT_AMBULATORY_CARE_PROVIDER_SITE_OTHER): Payer: Medicare Other | Admitting: Obstetrics and Gynecology

## 2018-09-03 VITALS — BP 138/63 | HR 87 | Ht 63.0 in | Wt 224.5 lb

## 2018-09-03 DIAGNOSIS — N9089 Other specified noninflammatory disorders of vulva and perineum: Secondary | ICD-10-CM | POA: Diagnosis not present

## 2018-09-03 DIAGNOSIS — N952 Postmenopausal atrophic vaginitis: Secondary | ICD-10-CM

## 2018-09-03 DIAGNOSIS — L900 Lichen sclerosus et atrophicus: Secondary | ICD-10-CM | POA: Diagnosis not present

## 2018-09-03 MED ORDER — ESTRADIOL 0.1 MG/GM VA CREA: 0.5000 | TOPICAL_CREAM | Freq: Every day | VAGINAL | 3 refills | Status: DC

## 2018-09-03 MED ORDER — HYDROCORTISONE ACETATE 25 MG RE SUPP: RECTAL | 1 refills | Status: DC

## 2018-09-03 NOTE — Progress Notes (Signed)
Chief complaint: 1.  Vulvar biopsy follow-up 2.  History of lichen sclerosus  Patient presents for two-week follow-up after perianal biopsy of pearly white lesion noted at the 1 o'clock position at the anus. Fina is using her Estrace cream once or twice a week and is using the hydrocortisone suppositories twice weekly.  She is having good results with this dosing regimen and that she is not experiencing any perineal itching or burning.  Esha did well following her perianal biopsy. Pathology from the biopsy is benign: Benign dermal hyperplasia with hyalinization  OBJECTIVE: BP 138/63   Pulse 87   Ht 5\' 3"  (1.6 m)   Wt 224 lb 8 oz (101.8 kg)   BMI 39.77 kg/m  Pleasant elderly female in no acute dress. Abdomen: Soft, nontender Pelvic exam: External genitalia-atrophic changes; minimal plaquing noted at the posterior fourchette; no epithelial skin breakdown; no erosions BUS-normal Vagina-moderate atrophy without erosions noted at the distal vagina RV-biopsy site at 1:00 is 90% healed with minimal skin separation 3 mm; no erythema; no discharge  ASSESSMENT: 1.  Lichen sclerosis, clinically stable with minimal leukoplakia noted at the posterior fourchette 2.  Status post perianal biopsy; benign; biopsy site healing well 3.  Vaginal atrophy with prior history of erosive vaginitis, responded well to steroids  PLAN: 1.  Continue with Estrace cream intravaginal twice weekly 1/2 to 1 g 2.  Continue with hydrocortisone suppositories intravaginal 25 mg twice weekly 3.  Return in 4 months for follow-up with Dr. Logan Bores  A total of 15 minutes were spent face-to-face with the patient during this encounter and over half of that time dealt with counseling and coordination of care.  Herold Harms, MD  Note: This dictation was prepared with Dragon dictation along with smaller phrase technology. Any transcriptional errors that result from this process are unintentional.

## 2018-09-03 NOTE — Patient Instructions (Addendum)
1.  Continue using the hydrocortisone suppositories intravaginal twice a week 2.  Continue using Estrace cream intravaginal weekly 3.  Return in 4 months for follow-up on lichen sclerosus and vaginal atrophy-Dr. Logan Bores

## 2018-12-09 ENCOUNTER — Other Ambulatory Visit: Payer: Self-pay | Admitting: Rheumatology

## 2018-12-09 DIAGNOSIS — M25511 Pain in right shoulder: Secondary | ICD-10-CM

## 2018-12-21 ENCOUNTER — Ambulatory Visit: Payer: Medicare Other

## 2018-12-22 ENCOUNTER — Ambulatory Visit
Admission: RE | Admit: 2018-12-22 | Discharge: 2018-12-22 | Disposition: A | Discharge status: Home / Self Care | Payer: Medicare Other | Source: Ambulatory Visit | Attending: Rheumatology | Admitting: Rheumatology

## 2018-12-22 DIAGNOSIS — M25511 Pain in right shoulder: Secondary | ICD-10-CM | POA: Diagnosis not present

## 2019-01-06 ENCOUNTER — Ambulatory Visit (INDEPENDENT_AMBULATORY_CARE_PROVIDER_SITE_OTHER): Payer: Medicare Other | Admitting: Obstetrics and Gynecology

## 2019-01-06 ENCOUNTER — Encounter: Payer: Self-pay | Admitting: Obstetrics and Gynecology

## 2019-01-06 VITALS — BP 144/81 | HR 70 | Ht 63.0 in | Wt 218.9 lb

## 2019-01-06 DIAGNOSIS — R350 Frequency of micturition: Secondary | ICD-10-CM | POA: Diagnosis not present

## 2019-01-06 DIAGNOSIS — L900 Lichen sclerosus et atrophicus: Secondary | ICD-10-CM | POA: Diagnosis not present

## 2019-01-06 DIAGNOSIS — N952 Postmenopausal atrophic vaginitis: Secondary | ICD-10-CM

## 2019-01-06 LAB — POCT URINALYSIS DIPSTICK
Bilirubin, UA: NEGATIVE
Glucose, UA: NEGATIVE
KETONES UA: NEGATIVE
Nitrite, UA: NEGATIVE
PROTEIN UA: POSITIVE — AB
SPEC GRAV UA: 1.01 (ref 1.010–1.025)
Urobilinogen, UA: 0.2 E.U./dL
pH, UA: 8 (ref 5.0–8.0)

## 2019-01-06 NOTE — Progress Notes (Signed)
Patient comes in today for follow up on her lichen sclerous. Patient is also having frequent urination. Urine sample collected today.

## 2019-01-06 NOTE — Progress Notes (Signed)
HPI:      Emily Fox is a 78 y.o. [phone] who LMP was No LMP recorded. Patient has had a hysterectomy.  Subjective:   She presents today for follow-up of lichen sclerosus and vaginal atrophy.  She says that she has trouble affording Estrace and Temovate cream the combination is more than $700. She also complains of bladder frequency.    Hx: The following portions of the patient's history were reviewed and updated as appropriate:             She  has a past medical history of Asthma, Complication of anesthesia, COPD (chronic obstructive pulmonary disease) (HCC), Diverticula of colon, GERD (gastroesophageal reflux disease), History of shingles, Hypertension, Increased BMI, Lichen sclerosus, Lower extremity edema, Menopause, Osteoarthritis, Overactive bladder, Psoriatic arthritis (HCC), Seasonal allergies, and Urinary incontinence. She does not have any pertinent problems on file. She  has a past surgical history that includes knee replacement; Vaginal hysterectomy; Vein Surgery; Tonsillectomy; Ganglion cyst excision; lower leg fracture; Knee Arthroplasty (Left, 12/05/2016); Joint replacement; Fracture surgery; and Colonoscopy with propofol (N/A, 06/23/2018). Her family history includes Diabetes in her mother; Heart disease in her mother. She  reports that she quit smoking about 50 years ago. She has never used smokeless tobacco. She reports that she does not drink alcohol or use drugs. She has a current medication list which includes the following prescription(s): acetaminophen, albuterol, aspirin, calcium carbonate, cetirizine, cholecalciferol, clindamycin, b-12, diclofenac sodium, estradiol, fluticasone, fluticasone-salmeterol, folic acid, hydrocortisone, hyoscyamine, infliximab, magnesium, methotrexate, montelukast, multi-vitamins, naproxen sodium, omeprazole, oxybutynin, potassium chloride, probiotic product, ranitidine, torsemide, triamterene-hydrochlorothiazide, trolamine salicylate, vitamin  e, and carvedilol. She is allergic to hylan g-f 20; lisinopril; and penicillins.       Review of Systems:  Review of Systems  Constitutional: Denied constitutional symptoms, night sweats, recent illness, fatigue, fever, insomnia and weight loss.  Eyes: Denied eye symptoms, eye pain, photophobia, vision change and visual disturbance.  Ears/Nose/Throat/Neck: Denied ear, nose, throat or neck symptoms, hearing loss, nasal discharge, sinus congestion and sore throat.  Cardiovascular: Denied cardiovascular symptoms, arrhythmia, chest pain/pressure, edema, exercise intolerance, orthopnea and palpitations.  Respiratory: Denied pulmonary symptoms, asthma, pleuritic pain, productive sputum, cough, dyspnea and wheezing.  Gastrointestinal: Denied, gastro-esophageal reflux, melena, nausea and vomiting.  Genitourinary: See HPI for additional information.  Musculoskeletal: Denied musculoskeletal symptoms, stiffness, swelling, muscle weakness and myalgia.  Dermatologic: Denied dermatology symptoms, rash and scar.  Neurologic: Denied neurology symptoms, dizziness, headache, neck pain and syncope.  Psychiatric: Denied psychiatric symptoms, anxiety and depression.  Endocrine: Denied endocrine symptoms including hot flashes and night sweats.   Meds:   Current Outpatient Medications on File Prior to Visit  Medication Sig Dispense Refill  . acetaminophen (TYLENOL) 500 MG tablet Take 1,000 mg by mouth 2 (two) times daily.    Marland Kitchen albuterol (PROAIR HFA) 108 (90 BASE) MCG/ACT inhaler Inhale 2 puffs into the lungs every 4 (four) hours as needed for wheezing or shortness of breath.     Marland Kitchen aspirin 81 MG tablet Take 81 mg by mouth daily.    . calcium carbonate (OSCAL) 1500 (600 Ca) MG TABS tablet Take 600 mg of elemental calcium by mouth daily with breakfast.    . cetirizine (ZYRTEC) 10 MG tablet Take 10 mg by mouth at bedtime as needed for allergies.     . Cholecalciferol (D3-1000 PO) Take 1,000 Units by mouth daily.     . clindamycin (CLEOCIN) 150 MG capsule once daily as needed (for teeth cleaning).     Marland Kitchen  Cyanocobalamin (B-12) 1000 MCG CAPS Take 1 tablet by mouth daily.    . diclofenac sodium (VOLTAREN) 1 % GEL Apply topically.    Marland Kitchen estradiol (ESTRACE) 0.1 MG/GM vaginal cream Place 0.5 Applicatorfuls vaginally at bedtime. 1/2 gram twice a week 42.5 g 3  . fluticasone (FLONASE) 50 MCG/ACT nasal spray Place 2 sprays into both nostrils daily as needed for allergies.     . fluticasone-salmeterol (ADVAIR HFA) 115-21 MCG/ACT inhaler Inhale 2 puffs into the lungs 2 (two) times daily.    . folic acid (FOLVITE) 1 MG tablet Take 1 mg by mouth daily.    . hydrocortisone (ANUSOL-HC) 25 MG suppository 1 suppository intravaginal twice a week 12 suppository 1  . hyoscyamine (LEVSIN SL) 0.125 MG SL tablet Place 0.125 mg under the tongue every 4 (four) hours as needed for cramping.    . inFLIXimab (REMICADE) 100 MG injection Inject into the vein every 6 (six) weeks. Every 6 weeks  Next is due 12-18-2016    . Magnesium (GNP MAGNESIUM) 250 MG TABS Take 250 mg by mouth daily.    . methotrexate (RHEUMATREX) 2.5 MG tablet Take 15 mg by mouth every 7 (seven) days. Caution:Chemotherapy. Protect from light.  6 tablets on Fridays    . montelukast (SINGULAIR) 10 MG tablet Take 10 mg by mouth at bedtime.    . Multiple Vitamin (MULTI-VITAMINS) TABS Take 1 tablet by mouth daily. Senior Silver    . naproxen sodium (ANAPROX) 220 MG tablet Take 220 mg by mouth 2 (two) times daily as needed.    Marland Kitchen omeprazole (PRILOSEC) 40 MG capsule Take 40 mg by mouth daily.    Marland Kitchen oxybutynin (DITROPAN-XL) 5 MG 24 hr tablet Take 1 tablet (5 mg total) by mouth at bedtime. 30 tablet 6  . potassium chloride (K-DUR) 10 MEQ tablet Take 10 mEq by mouth daily.     . Probiotic Product (PROBIOTIC PO) Take 1 tablet by mouth daily.    . ranitidine (ZANTAC) 300 MG tablet Take 300 mg by mouth at bedtime.     . torsemide (DEMADEX) 5 MG tablet Take by mouth.    .  triamterene-hydrochlorothiazide (DYAZIDE) 37.5-25 MG capsule Take 1 capsule by mouth daily.    Marland Kitchen trolamine salicylate (ASPERCREME) 10 % cream Apply 1 application topically as needed for muscle pain.    . vitamin E (VITAMIN E) 400 UNIT capsule Take 400 Units by mouth daily.    . carvedilol (COREG) 6.25 MG tablet Take by mouth.     No current facility-administered medications on file prior to visit.     Objective:     Vitals:   01/06/19 1028  BP: (!) 144/81  Pulse: 70                Pelvic:   Vulva: Normal appearance.    Posterior white lesion approximately three-quarter centimeter in size.  This is obviously the area that was biopsied  Vagina: No lesions or abnormalities noted.  Significant vaginal atrophy  Support: Normal pelvic support.  Urethra No masses tenderness or scarring.  Meatus Normal size without lesions or prolapse.  Cervix: Surgically absent   Anus: Normal exam.  No lesions.  Perineum: Normal exam.  No lesions.        Bimanual   Uterus: Surgically absent   Adnexae: No masses.  Non-tender to palpation.  Cul-de-sac: Negative for abnormality.     Assessment:    [id number] Patient Active Problem List   Diagnosis Date Noted  . Stress due  to illness of family member 05/08/2018  . S/P total knee arthroplasty 12/05/2016  . Lichen sclerosus 10/27/2015  . Vaginal atrophy 10/27/2015  . Unstable bladder 10/27/2015  . Adult BMI 30+ 10/01/2014  . Arthritis, degenerative 03/08/2014  . Asthma without status asthmaticus 02/28/2014  . B12 deficiency 02/28/2014  . Accumulation of fluid in tissues 02/28/2014  . Acid reflux 02/28/2014  . HLD (hyperlipidemia) 02/28/2014  . BP (high blood pressure) 02/28/2014  . Other allergic rhinitis 02/28/2014  . Psoriatic arthritis (HCC) 02/28/2014     1. Frequent urination   2. Lichen sclerosus   3. Vaginal atrophy     Patient is obviously not using Temovate and Estrace cream as directed.  Continued vaginal atrophy and lichen  sclerosis noted.   Plan:            1.  We have discussed the use of Estrace cream and Temovate cream in detail.  I have talked with her about ways to obtain the medicine at a cheaper rate.  She will try a different pharmacy and possible use good Rx.  I have explained to her that should this posterior lesion not improve I would consider a rebiopsy because of its significant localized appearance.  2.  Possible UTI.  Urine sent for culture-will contact patient if needed antibiotics Orders Orders Placed This Encounter  Procedures  . Urine Culture  . POCT urinalysis dipstick    No orders of the defined types were placed in this encounter.     F/U  Return in about 3 months (around 04/06/2019). I spent 20 minutes involved in the care of this patient of which greater than 50% was spent discussing vaginal atrophy and lichen sclerosis and medications.  All questions answered.  Elonda Huskyavid J. Evans, M.D. 01/06/2019 11:13 AM

## 2019-01-08 LAB — URINE CULTURE

## 2019-01-15 ENCOUNTER — Other Ambulatory Visit: Payer: Self-pay | Admitting: Surgical

## 2019-01-15 MED ORDER — NITROFURANTOIN MONOHYD MACRO 100 MG PO CAPS: 100.0000 mg | ORAL_CAPSULE | Freq: Two times a day (BID) | ORAL | 0 refills | Status: DC

## 2019-02-09 ENCOUNTER — Ambulatory Visit: Payer: Medicare Other | Admitting: Urology

## 2019-04-08 ENCOUNTER — Encounter: Payer: Medicare Other | Admitting: Obstetrics and Gynecology

## 2019-04-24 ENCOUNTER — Telehealth: Payer: Self-pay | Admitting: Obstetrics and Gynecology

## 2019-04-24 MED ORDER — OXYBUTYNIN CHLORIDE ER 5 MG PO TB24: 5.0000 mg | ORAL_TABLET | Freq: Every day | ORAL | 6 refills | Status: DC

## 2019-04-24 NOTE — Telephone Encounter (Signed)
The patient called and stated that she needs a prescription refill of the medication oxybutynin (DITROPAN-XL) 5 MG 24 hr tablet. Please advise.

## 2019-04-24 NOTE — Telephone Encounter (Signed)
Tried to call patient and line was busy. Prescription was sent to the pharmacy.

## 2019-04-24 NOTE — Telephone Encounter (Signed)
OK to refill

## 2019-05-06 ENCOUNTER — Ambulatory Visit (INDEPENDENT_AMBULATORY_CARE_PROVIDER_SITE_OTHER): Payer: Medicare Other | Admitting: Obstetrics and Gynecology

## 2019-05-06 ENCOUNTER — Encounter: Payer: Self-pay | Admitting: Obstetrics and Gynecology

## 2019-05-06 ENCOUNTER — Telehealth: Payer: Self-pay | Admitting: Obstetrics and Gynecology

## 2019-05-06 ENCOUNTER — Other Ambulatory Visit: Payer: Self-pay

## 2019-05-06 VITALS — BP 152/79 | HR 70 | Ht 63.0 in | Wt 216.6 lb

## 2019-05-06 DIAGNOSIS — L900 Lichen sclerosus et atrophicus: Secondary | ICD-10-CM | POA: Diagnosis not present

## 2019-05-06 DIAGNOSIS — N952 Postmenopausal atrophic vaginitis: Secondary | ICD-10-CM

## 2019-05-06 MED ORDER — CLOBETASOL PROPIONATE 0.05 % EX CREA: 1.0000 "application " | TOPICAL_CREAM | Freq: Two times a day (BID) | CUTANEOUS | 2 refills | Status: DC

## 2019-05-06 NOTE — Progress Notes (Signed)
Patient comes in today for 3 month follow up for her lichen sclerosus. She states that she has noticed that it has gotten worse.

## 2019-05-06 NOTE — Telephone Encounter (Signed)
The patient stated that Dr. Amalia Hailey needs to send her prescription from today to [facility] in [place].

## 2019-05-06 NOTE — Progress Notes (Signed)
HPI:      [name] is a 78 y.o. [phone] who LMP was No LMP recorded. Patient has had a hysterectomy.  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  She has had significant lichen sclerosus for many years.  She has been prescribed Temovate cream but she says she is not using it at this time.   Hx: The following portions of the patient's history were reviewed and updated as appropriate:             She  has a past medical history of Asthma, Complication of anesthesia, COPD (chronic obstructive pulmonary disease) (HCC), Diverticula of colon, GERD (gastroesophageal reflux disease), History of shingles, Hypertension, Increased BMI, Lichen sclerosus, Lower extremity edema, Menopause, Osteoarthritis, Overactive bladder, Psoriatic arthritis (HCC), Seasonal allergies, and Urinary incontinence. She does not have any pertinent problems on file. She  has a past surgical history that includes knee replacement; Vaginal hysterectomy; Vein Surgery; Tonsillectomy; Ganglion cyst excision; lower leg fracture; Knee Arthroplasty (Left, 12/05/2016); Joint replacement; Fracture surgery; and Colonoscopy with propofol (N/A, 06/23/2018). Her family history includes Diabetes in her mother; Heart disease in her mother. She  reports that she quit smoking about 50 years ago. She has never used smokeless tobacco. She reports that she does not drink alcohol or use drugs. She has a current medication list which includes the following prescription(s): acetaminophen, albuterol, aspirin, calcium carbonate, cetirizine, cholecalciferol, clindamycin, b-12, estradiol, fluticasone, fluticasone-salmeterol, folic acid, hydrocortisone, hyoscyamine, infliximab, magnesium, methotrexate, montelukast, multi-vitamins, naproxen sodium, omeprazole, oxybutynin, potassium chloride, probiotic product, ranitidine, torsemide, triamterene-hydrochlorothiazide, trolamine salicylate, vitamin e, and carvedilol. She is allergic to  hylan g-f 20; lisinopril; and penicillins.       Review of Systems:  Review of Systems  Constitutional: Denied constitutional symptoms, night sweats, recent illness, fatigue, fever, insomnia and weight loss.  Eyes: Denied eye symptoms, eye pain, photophobia, vision change and visual disturbance.  Ears/Nose/Throat/Neck: Denied ear, nose, throat or neck symptoms, hearing loss, nasal discharge, sinus congestion and sore throat.  Cardiovascular: Denied cardiovascular symptoms, arrhythmia, chest pain/pressure, edema, exercise intolerance, orthopnea and palpitations.  Respiratory: Denied pulmonary symptoms, asthma, pleuritic pain, productive sputum, cough, dyspnea and wheezing.  Gastrointestinal: Denied, gastro-esophageal reflux, melena, nausea and vomiting.  Genitourinary: Denied genitourinary symptoms including symptomatic vaginal discharge, pelvic relaxation issues, and urinary complaints.  Musculoskeletal: Denied musculoskeletal symptoms, stiffness, swelling, muscle weakness and myalgia.  Dermatologic: Denied dermatology symptoms, rash and scar.  Neurologic: Denied neurology symptoms, dizziness, headache, neck pain and syncope.  Psychiatric: Denied psychiatric symptoms, anxiety and depression.  Endocrine: Denied endocrine symptoms including hot flashes and night sweats.   Meds:   Current Outpatient Medications on File Prior to Visit  Medication Sig Dispense Refill  . acetaminophen (TYLENOL) 500 MG tablet Take 1,000 mg by mouth 2 (two) times daily.    Marland Kitchen. albuterol (PROAIR HFA) 108 (90 BASE) MCG/ACT inhaler Inhale 2 puffs into the lungs every 4 (four) hours as needed for wheezing or shortness of breath.     Marland Kitchen. aspirin 81 MG tablet Take 81 mg by mouth daily.    . calcium carbonate (OSCAL) 1500 (600 Ca) MG TABS tablet Take 600 mg of elemental calcium by mouth daily with breakfast.    . cetirizine (ZYRTEC) 10 MG tablet Take 10 mg by mouth at bedtime as needed for allergies.     . Cholecalciferol  (D3-1000 PO) Take 1,000 Units by mouth daily.    . clindamycin (CLEOCIN) 150 MG capsule once daily as needed (for teeth  cleaning).     . Cyanocobalamin (B-12) 1000 MCG CAPS Take 1 tablet by mouth daily.    Marland Kitchen estradiol (ESTRACE) 0.1 MG/GM vaginal cream Place 0.5 Applicatorfuls vaginally at bedtime. 1/2 gram twice a week 42.5 g 3  . fluticasone (FLONASE) 50 MCG/ACT nasal spray Place 2 sprays into both nostrils daily as needed for allergies.     . fluticasone-salmeterol (ADVAIR HFA) 115-21 MCG/ACT inhaler Inhale 2 puffs into the lungs 2 (two) times daily.    . folic acid (FOLVITE) 1 MG tablet Take 1 mg by mouth daily.    . hydrocortisone (ANUSOL-HC) 25 MG suppository 1 suppository intravaginal twice a week 12 suppository 1  . hyoscyamine (LEVSIN SL) 0.125 MG SL tablet Place 0.125 mg under the tongue every 4 (four) hours as needed for cramping.    . inFLIXimab (REMICADE) 100 MG injection Inject into the vein every 6 (six) weeks. Every 6 weeks  Next is due 12-18-2016    . Magnesium (GNP MAGNESIUM) 250 MG TABS Take 250 mg by mouth daily.    . methotrexate (RHEUMATREX) 2.5 MG tablet Take 15 mg by mouth every 7 (seven) days. Caution:Chemotherapy. Protect from light.  6 tablets on Fridays    . montelukast (SINGULAIR) 10 MG tablet Take 10 mg by mouth at bedtime.    . Multiple Vitamin (MULTI-VITAMINS) TABS Take 1 tablet by mouth daily. Senior Silver    . naproxen sodium (ANAPROX) 220 MG tablet Take 220 mg by mouth 2 (two) times daily as needed.    Marland Kitchen omeprazole (PRILOSEC) 40 MG capsule Take 40 mg by mouth daily.    Marland Kitchen oxybutynin (DITROPAN-XL) 5 MG 24 hr tablet Take 1 tablet (5 mg total) by mouth at bedtime. 30 tablet 6  . potassium chloride (K-DUR) 10 MEQ tablet Take 10 mEq by mouth daily.     . Probiotic Product (PROBIOTIC PO) Take 1 tablet by mouth daily.    . ranitidine (ZANTAC) 300 MG tablet Take 300 mg by mouth at bedtime.     . torsemide (DEMADEX) 5 MG tablet Take by mouth.    .  triamterene-hydrochlorothiazide (DYAZIDE) 37.5-25 MG capsule Take 1 capsule by mouth daily.    Marland Kitchen trolamine salicylate (ASPERCREME) 10 % cream Apply 1 application topically as needed for muscle pain.    . vitamin E (VITAMIN E) 400 UNIT capsule Take 400 Units by mouth daily.    . carvedilol (COREG) 6.25 MG tablet Take by mouth.     No current facility-administered medications on file prior to visit.     Objective:     Vitals:   05/06/19 1008  BP: (!) 152/79  Pulse: 70               Pelvic:   Vulva: Normal appearance.    Posterior white lesion on the right lower labia.  It is raised.  Entire vulva including clitoral hood appears to be lichen sclerosus with loss of architecture and fusion in some areas.   Vagina: No lesions or abnormalities noted.  Significant vaginal atrophy   Support: Normal pelvic support.   Urethra No masses tenderness or scarring.   Meatus Normal size without lesions or prolapse.   Cervix: Surgically absent    Anus: Normal exam.  No lesions.   Perineum: Normal exam.  No lesions.         Bimanual   Uterus: Surgically absent     Adnexae: No masses.  Non-tender to palpation.    Cul-de-sac: Negative for abnormality  Assessment:    [id number] Patient Active Problem List   Diagnosis Date Noted  . Stress due to illness of family member 05/08/2018  . S/P total knee arthroplasty 12/05/2016  . Lichen sclerosus 10/27/2015  . Vaginal atrophy 10/27/2015  . Unstable bladder 10/27/2015  . Adult BMI 30+ 10/01/2014  . Arthritis, degenerative 03/08/2014  . Asthma without status asthmaticus 02/28/2014  . B12 deficiency 02/28/2014  . Accumulation of fluid in tissues 02/28/2014  . Acid reflux 02/28/2014  . HLD (hyperlipidemia) 02/28/2014  . BP (high blood pressure) 02/28/2014  . Other allergic rhinitis 02/28/2014  . Psoriatic arthritis (HCC) 02/28/2014     1. Lichen sclerosus   2. Vaginal atrophy     Worsening lichen sclerosus because patient not using  medication as directed.  The raised lesion on the posterior right labia is somewhat concerning.  I have strongly recommend a biopsy.  Patient has refused biopsy today she says it is not a good time because she has other medical issues she is dealing with.  I explained the concern and the necessity of biopsy and she says that she will come back in 1 month to be reevaluated and consider the biopsy at that time.   Plan:            1.  Strongly emphasized the need for use of Temovate cream.  Strongly recommend biopsy.  2.  Patient to return in 1 month for reevaluation for progress and to reconsider vulvar biopsy Orders No orders of the defined types were placed in this encounter.   No orders of the defined types were placed in this encounter.     F/U  No follow-ups on file. I spent 22 minutes involved in the care of this patient of which greater than 50% was spent discussing vulvar dystrophy, use of medications as prescribed, need for vulvar biopsy.  All questions answered.  Elonda Huskyavid J. Danni Leabo, M.D. 05/06/2019 10:30 AM

## 2019-05-06 NOTE — Telephone Encounter (Signed)
This has already been done by dr. Amalia Hailey.

## 2019-05-07 ENCOUNTER — Telehealth: Payer: Self-pay | Admitting: Obstetrics and Gynecology

## 2019-05-07 NOTE — Telephone Encounter (Signed)
The patient called and stated that she is unable to get the medication she needs at any pharmacy. The patient also stated that she really needs this prescription and is hoping to get something else prescribed because she really needs it. Please advise.

## 2019-05-08 ENCOUNTER — Other Ambulatory Visit: Payer: Self-pay

## 2019-05-08 ENCOUNTER — Telehealth: Payer: Self-pay | Admitting: Obstetrics and Gynecology

## 2019-05-08 DIAGNOSIS — L900 Lichen sclerosus et atrophicus: Secondary | ICD-10-CM

## 2019-05-08 MED ORDER — CLOBETASOL PROPIONATE 0.05 % EX CREA: 1.0000 "application " | TOPICAL_CREAM | Freq: Two times a day (BID) | CUTANEOUS | 2 refills | Status: DC

## 2019-05-08 NOTE — Telephone Encounter (Signed)
Called pt at the number given to reach her at and the number was busy. Dialed the number 3 times.

## 2019-05-08 NOTE — Telephone Encounter (Signed)
Please see another encounter.

## 2019-05-08 NOTE — Telephone Encounter (Signed)
The patient called again stating to speak with a nurse today. The pt left her phone number to be called back. [phone]

## 2019-05-08 NOTE — Telephone Encounter (Signed)
Pt called no answer LM via voicemail to call the office to speak more about the information that I received from the pharmacy.

## 2019-05-11 ENCOUNTER — Other Ambulatory Visit: Payer: Self-pay | Admitting: Surgical

## 2019-05-11 DIAGNOSIS — L900 Lichen sclerosus et atrophicus: Secondary | ICD-10-CM

## 2019-05-11 MED ORDER — CLOBETASOL PROPIONATE 0.05 % EX CREA: 1.0000 "application " | TOPICAL_CREAM | Freq: Two times a day (BID) | CUTANEOUS | 2 refills | Status: AC

## 2019-05-12 ENCOUNTER — Telehealth: Payer: Self-pay | Admitting: Obstetrics and Gynecology

## 2019-05-12 NOTE — Telephone Encounter (Signed)
Patient called requesting a refill on hydrocortisone sent to [facility]. Thanks

## 2019-05-12 NOTE — Telephone Encounter (Signed)
Please advise on refill. Patient states that she uses this vaginally for her lichen sclersus.

## 2019-05-14 NOTE — Telephone Encounter (Signed)
Patient called back and stated that [facility] keeps calling her to pick up the clobetasol cream. I explained to the patient that there has not been a prescription sent there since June 17. She was very upset because she because she has not received her medication. I have called every local pharmacy to see if they can get the name brand. No can order it. I have called [facility] back and they did not send a PA for medication. They stated that the prescription is $990. I called the patient back to let her know everything. She stated that she would get the prescription this time. I told her I would call her if I hear anything else.

## 2019-05-21 NOTE — Telephone Encounter (Signed)
I have sent PA to insurance.

## 2019-05-28 NOTE — Telephone Encounter (Signed)
Spoke with patient and she has not heard anything from [employer] about the prescription. She has called the pharmacy and told them she would pay for the prescription. They still have not sent prescription to her.

## 2019-06-04 ENCOUNTER — Other Ambulatory Visit: Payer: Self-pay

## 2019-06-04 ENCOUNTER — Encounter: Payer: Self-pay | Admitting: Obstetrics and Gynecology

## 2019-06-04 ENCOUNTER — Ambulatory Visit (INDEPENDENT_AMBULATORY_CARE_PROVIDER_SITE_OTHER): Payer: Medicare Other | Admitting: Obstetrics and Gynecology

## 2019-06-04 VITALS — BP 136/71 | HR 76 | Ht 63.0 in | Wt 222.0 lb

## 2019-06-04 DIAGNOSIS — L900 Lichen sclerosus et atrophicus: Secondary | ICD-10-CM | POA: Diagnosis not present

## 2019-06-04 NOTE — Progress Notes (Signed)
Patient comes in today for 4 week follow up for her lichen sclerosus. She has no been able to get the Temovate.

## 2019-06-04 NOTE — Progress Notes (Signed)
HPI:      [name] is a 78 y.o. [phone] who LMP was No LMP recorded. Patient has had a hysterectomy.  Subjective:   She presents today patient states that she has not been able to get the Temovate cream.  She describes a history of using clobetasol and causing a significant inflammatory/blister allergic reaction.  She was previously told that the inert ingredients in the clobetasol had caused the reaction.  Of significant note she was previously used Temovate cream without the same reaction.  She has been trying to get Temovate cream through her insurance company and we have filed a prior authorization to get Temovate and to date we have been unsuccessful. Patient states that she is obviously no better but still does not want a biopsy today.    Hx: The following portions of the patient's history were reviewed and updated as appropriate:             She  has a past medical history of Asthma, Complication of anesthesia, COPD (chronic obstructive pulmonary disease) (HCC), Diverticula of colon, GERD (gastroesophageal reflux disease), History of shingles, Hypertension, Increased BMI, Lichen sclerosus, Lower extremity edema, Menopause, Osteoarthritis, Overactive bladder, Psoriatic arthritis (HCC), Seasonal allergies, and Urinary incontinence. She does not have any pertinent problems on file. She  has a past surgical history that includes knee replacement; Vaginal hysterectomy; Vein Surgery; Tonsillectomy; Ganglion cyst excision; lower leg fracture; Knee Arthroplasty (Left, 12/05/2016); Joint replacement; Fracture surgery; and Colonoscopy with propofol (N/A, 06/23/2018). Her family history includes Diabetes in her mother; Heart disease in her mother. She  reports that she quit smoking about 50 years ago. She has never used smokeless tobacco. She reports that she does not drink alcohol or use drugs. She has a current medication list which includes the following prescription(s): acetaminophen,  albuterol, aspirin, calcium carbonate, cetirizine, cholecalciferol, clindamycin, clobetasol cream, b-12, estradiol, fluticasone, fluticasone-salmeterol, folic acid, hydrocortisone, hyoscyamine, infliximab, magnesium, methotrexate, montelukast, multi-vitamins, naproxen sodium, omeprazole, oxybutynin, potassium chloride, probiotic product, ranitidine, torsemide, triamterene-hydrochlorothiazide, trolamine salicylate, vitamin e, and carvedilol. She is allergic to hylan g-f 20; lisinopril; and penicillins.       Review of Systems:  Review of Systems  Constitutional: Denied constitutional symptoms, night sweats, recent illness, fatigue, fever, insomnia and weight loss.  Eyes: Denied eye symptoms, eye pain, photophobia, vision change and visual disturbance.  Ears/Nose/Throat/Neck: Denied ear, nose, throat or neck symptoms, hearing loss, nasal discharge, sinus congestion and sore throat.  Cardiovascular: Denied cardiovascular symptoms, arrhythmia, chest pain/pressure, edema, exercise intolerance, orthopnea and palpitations.  Respiratory: Denied pulmonary symptoms, asthma, pleuritic pain, productive sputum, cough, dyspnea and wheezing.  Gastrointestinal: Denied, gastro-esophageal reflux, melena, nausea and vomiting.  Genitourinary: See HPI for additional information.  Musculoskeletal: Denied musculoskeletal symptoms, stiffness, swelling, muscle weakness and myalgia.  Dermatologic: Denied dermatology symptoms, rash and scar.  Neurologic: Denied neurology symptoms, dizziness, headache, neck pain and syncope.  Psychiatric: Denied psychiatric symptoms, anxiety and depression.  Endocrine: Denied endocrine symptoms including hot flashes and night sweats.   Meds:   Current Outpatient Medications on File Prior to Visit  Medication Sig Dispense Refill  . acetaminophen (TYLENOL) 500 MG tablet Take 1,000 mg by mouth 2 (two) times daily.    Marland Kitchen. albuterol (PROAIR HFA) 108 (90 BASE) MCG/ACT inhaler Inhale 2 puffs  into the lungs every 4 (four) hours as needed for wheezing or shortness of breath.     Marland Kitchen. aspirin 81 MG tablet Take 81 mg by mouth daily.    . calcium carbonate (OSCAL)  1500 (600 Ca) MG TABS tablet Take 600 mg of elemental calcium by mouth daily with breakfast.    . cetirizine (ZYRTEC) 10 MG tablet Take 10 mg by mouth at bedtime as needed for allergies.     . Cholecalciferol (D3-1000 PO) Take 1,000 Units by mouth daily.    . clindamycin (CLEOCIN) 150 MG capsule once daily as needed (for teeth cleaning).     . clobetasol cream (TEMOVATE) 0.05 % Apply 1 application topically 2 (two) times daily for 30 days. Use for 30 days twice a day as directed then use daily as directed. 60 g 2  . Cyanocobalamin (B-12) 1000 MCG CAPS Take 1 tablet by mouth daily.    Marland Kitchen. estradiol (ESTRACE) 0.1 MG/GM vaginal cream Place 0.5 Applicatorfuls vaginally at bedtime. 1/2 gram twice a week 42.5 g 3  . fluticasone (FLONASE) 50 MCG/ACT nasal spray Place 2 sprays into both nostrils daily as needed for allergies.     . fluticasone-salmeterol (ADVAIR HFA) 115-21 MCG/ACT inhaler Inhale 2 puffs into the lungs 2 (two) times daily.    . folic acid (FOLVITE) 1 MG tablet Take 1 mg by mouth daily.    . hydrocortisone (ANUSOL-HC) 25 MG suppository 1 suppository intravaginal twice a week 12 suppository 1  . hyoscyamine (LEVSIN SL) 0.125 MG SL tablet Place 0.125 mg under the tongue every 4 (four) hours as needed for cramping.    . inFLIXimab (REMICADE) 100 MG injection Inject into the vein every 6 (six) weeks. Every 6 weeks  Next is due 12-18-2016    . Magnesium (GNP MAGNESIUM) 250 MG TABS Take 250 mg by mouth daily.    . methotrexate (RHEUMATREX) 2.5 MG tablet Take 15 mg by mouth every 7 (seven) days. Caution:Chemotherapy. Protect from light.  6 tablets on Fridays    . montelukast (SINGULAIR) 10 MG tablet Take 10 mg by mouth at bedtime.    . Multiple Vitamin (MULTI-VITAMINS) TABS Take 1 tablet by mouth daily. Senior Silver    . naproxen  sodium (ANAPROX) 220 MG tablet Take 220 mg by mouth 2 (two) times daily as needed.    Marland Kitchen. omeprazole (PRILOSEC) 40 MG capsule Take 40 mg by mouth daily.    Marland Kitchen. oxybutynin (DITROPAN-XL) 5 MG 24 hr tablet Take 1 tablet (5 mg total) by mouth at bedtime. 30 tablet 6  . potassium chloride (K-DUR) 10 MEQ tablet Take 10 mEq by mouth daily.     . Probiotic Product (PROBIOTIC PO) Take 1 tablet by mouth daily.    . ranitidine (ZANTAC) 300 MG tablet Take 300 mg by mouth at bedtime.     . torsemide (DEMADEX) 5 MG tablet Take by mouth.    . triamterene-hydrochlorothiazide (DYAZIDE) 37.5-25 MG capsule Take 1 capsule by mouth daily.    Marland Kitchen. trolamine salicylate (ASPERCREME) 10 % cream Apply 1 application topically as needed for muscle pain.    . vitamin E (VITAMIN E) 400 UNIT capsule Take 400 Units by mouth daily.    . carvedilol (COREG) 6.25 MG tablet Take by mouth.     No current facility-administered medications on file prior to visit.     Objective:     Vitals:   06/04/19 1051  BP: 136/71  Pulse: 76                Assessment:    [id number] Patient Active Problem List   Diagnosis Date Noted  . Stress due to illness of family member 05/08/2018  . S/P total knee arthroplasty 12/05/2016  .  Lichen sclerosus 17/91/5056  . Vaginal atrophy 10/27/2015  . Unstable bladder 10/27/2015  . Adult BMI 30+ 10/01/2014  . Arthritis, degenerative 03/08/2014  . Asthma without status asthmaticus 02/28/2014  . B12 deficiency 02/28/2014  . Accumulation of fluid in tissues 02/28/2014  . Acid reflux 02/28/2014  . HLD (hyperlipidemia) 02/28/2014  . BP (high blood pressure) 02/28/2014  . Other allergic rhinitis 02/28/2014  . Psoriatic arthritis ([place]) 02/28/2014     1. Lichen sclerosus     Patient no better because she has been unable to obtain brand necessary medicine Temovate.  She is working on this with her Research officer, trade union and we are working this was trying to obtain prior authorization through  her insurance.   Plan:            1.  We will continue to try to get the necessary Temovate cream.  I have recommended biopsy of the 2 suspicious areas within 6 weeks unless she obtains the cream and is able to use it and significant improvement is noted. Orders No orders of the defined types were placed in this encounter.   No orders of the defined types were placed in this encounter.     F/U  Return in about 6 weeks (around 07/16/2019). I spent 16 minutes involved in the care of this patient of which greater than 50% was spent discussing Temovate cream versus clobetasol cream- patient's previous allergic reaction, methods of contact the insurance company and what to say to them regarding prior authorization.  Need for vulvar biopsy as previously discussed.  All questions answered.  Finis Bud, M.D. 06/04/2019 12:13 PM

## 2019-06-05 ENCOUNTER — Telehealth: Payer: Self-pay | Admitting: Obstetrics and Gynecology

## 2019-06-05 NOTE — Telephone Encounter (Signed)
Spoke with patient and she stated that her insurance did not get the PA. I let her know that I sent in on 05/21/19. I tried to call the insurance and was on hold for 20 minutes. I will call back Monday.

## 2019-06-05 NOTE — Telephone Encounter (Signed)
The patient called and stated that she needs to speak with Roselyn Reef today. The patient stated that she needs "her help with something" I asked the patient to please give me more information for the message. She declined. Please advise.

## 2019-06-19 NOTE — Telephone Encounter (Signed)
Spoke with Emily Fox and [employer] faxed over the forms that was needed to finishing the PA for the Temovate. I let her know that Dr. Amalia Hailey would be back in the office Tuesday and I will have him sign the forms. Patient verbalized understanding.

## 2019-06-26 NOTE — Telephone Encounter (Signed)
I have faxed the PA to the insurance company.

## 2019-06-29 ENCOUNTER — Telehealth: Payer: Self-pay | Admitting: Obstetrics and Gynecology

## 2019-06-29 NOTE — Telephone Encounter (Signed)
Patient called stating she needed to speak with you regarding a medication she had been talking with you about. Patient is aware you are not in the office today so it will be tomorrow before she receives a call back.  She asked that you call back on her cell [phone]. Thanks

## 2019-06-30 NOTE — Telephone Encounter (Signed)
Patient stated that the prescription approved through the insurance.

## 2019-06-30 NOTE — Telephone Encounter (Signed)
The patient called and stated that she needs to speak with Roselyn Reef. The pt stated that she needs to know if Roselyn Reef has spoken with her pharmacy and if "you all are on the same page" Pt is requesting a call back, Please advise.

## 2019-07-14 ENCOUNTER — Telehealth: Payer: Self-pay | Admitting: Obstetrics and Gynecology

## 2019-07-14 MED ORDER — ESTRADIOL 0.1 MG/GM VA CREA: 0.5000 | TOPICAL_CREAM | Freq: Every day | VAGINAL | 3 refills | Status: DC

## 2019-07-14 NOTE — Telephone Encounter (Signed)
The patient called and stated that she needs her prescription estradiol (ESTRACE) 0.1 MG/GM vaginal cream [9969] renewed. Please advise.

## 2019-07-14 NOTE — Telephone Encounter (Signed)
Prescription has been sent to the pharmacy and LM for patient that it was sent to the pharmacy.

## 2019-08-10 ENCOUNTER — Other Ambulatory Visit: Payer: Self-pay | Admitting: Nurse Practitioner

## 2019-08-10 DIAGNOSIS — R1032 Left lower quadrant pain: Secondary | ICD-10-CM

## 2019-08-12 ENCOUNTER — Other Ambulatory Visit: Payer: Self-pay

## 2019-08-12 ENCOUNTER — Ambulatory Visit
Admission: RE | Admit: 2019-08-12 | Discharge: 2019-08-12 | Disposition: A | Discharge status: Home / Self Care | Payer: Medicare Other | Source: Ambulatory Visit | Attending: Nurse Practitioner | Admitting: Nurse Practitioner

## 2019-08-12 ENCOUNTER — Encounter (INDEPENDENT_AMBULATORY_CARE_PROVIDER_SITE_OTHER): Payer: Self-pay

## 2019-08-12 DIAGNOSIS — R1032 Left lower quadrant pain: Secondary | ICD-10-CM | POA: Diagnosis present

## 2019-08-12 MED ORDER — IOHEXOL 300 MG/ML  SOLN
100.0000 mL | Freq: Once | INTRAMUSCULAR | Status: AC | PRN
  Administered 2019-08-12: 100 mL via INTRAVENOUS

## 2019-09-09 ENCOUNTER — Ambulatory Visit: Payer: Self-pay | Admitting: Surgery

## 2019-09-09 NOTE — H&P (Signed)
Subjective:   CC: Ventral hernia without obstruction or gangrene [K43.9]  HPI:  Emily Fox is a 78 y.o. female who was referred by Gershon Mussel, NP for evaluation of above. Symptoms were first noted several months ago. Pain is sharp and intermittent, confined to the left lower quadrant, without radiation.  Associated with occasional nausea/emesis, exacerbated by constipation.  Lump is not reducible. Patient has no symptoms of  chronic constipation, chronic cough.    Past Medical History:  has a past medical history of Abdominal pain, recurrent (03/28/2018), Asthma without status asthmaticus, unspecified, B12 deficiency, COPD (chronic obstructive pulmonary disease) (CMS-HCC), FH: colon polyps (03/28/2018), GERD (gastroesophageal reflux disease), Hyperlipidemia, Hypertension, Impaired fasting glucose (06/2013), LLQ pain (08/10/2019), Osteoarthritis, Other irritable bowel syndrome (03/28/2018), Perennial allergic rhinitis, and Psoriatic arthritis (CMS-HCC).  Past Surgical History:       Past Surgical History:  Procedure Laterality Date  . COLONOSCOPY  01/19/2013, 08/20/2007, 02/04/1989   FH Colon Polyps (Son): CBF 01/2018; Recall Ltr mailed 12/24/2017 (dh)  . COLONOSCOPY  06/23/2018   FHCP (Sister) CBF 06/2023  . EGD  04/29/2009, 01/23/1999   No repeat per RTE  . HYSTERECTOMY    . KNEE ARTHROSCOPY Left   . Left total knee arthroplasty using computer-assisted navigation  12/05/2016   Dr Marry Guan  . Right medial unicompartmental knee arthroplasty  07/29/2013   Dr. Leanor Kail  . TONSILLECTOMY    . VEIN SURGERY Bilateral     Family History: family history includes Allergies in her son and son; Asthma in her daughter; Colon polyps in her son; Coronary Artery Disease (Blocked arteries around heart) in her mother; Diabetes type II in her brother, brother, and mother; Lung disease in her sister; Lupus in her brother; Myocardial Infarction (Heart attack) in her  mother.  Social History:  reports that she has never smoked. She has never used smokeless tobacco. She reports that she does not drink alcohol or use drugs.  Current Medications: has a current medication list which includes the following prescription(s): acetaminophen, albuterol, aspirin, calcium carbonate-vitamin d3, carvedilol, cetirizine, cholecalciferol, clindamycin, cyanocobalamin (vitamin b-12), estradiol, fluticasone propion-salmeterol, folic acid, gabapentin, hyoscyamine, infliximab, lactobac 40-bifido 3-s.thermop, methotrexate, montelukast, multivitamin, omeprazole, oxybutynin, temovate, torsemide, triamterene-hydrochlorothiazide, trolamine salicylate, and vitamin e.  Allergies:       Allergies as of 09/08/2019 - Reviewed 09/08/2019  Allergen Reaction Noted  . Shellfish containing products Shortness Of Breath 09/08/2019  . Synvisc [hylan g-f 20] Shortness Of Breath 02/25/2014  . Lisinopril Hallucination 10/30/2017  . Penicillins Swelling 02/25/2014    ROS:  A 15 point review of systems was performed and pertinent positives and negatives noted in HPI   Objective:   BP 159/76   Pulse 62   Ht 152.4 cm (5')   Wt 98.9 kg (218 lb)   BMI 42.58 kg/m   Constitutional :  alert, appears stated age, cooperative, no distress and moderately obese  Lymphatics/Throat:  no asymmetry, masses, or scars  Respiratory:  clear to auscultation bilaterally  Cardiovascular:  regular rate and rhythm  Gastrointestinal: focal tenderness over LLQ, no obvious palpable lump due to body habitus and limited exam due to TTP.   Musculoskeletal: Steady gait and movement  Skin: Cool and moist, no visible surgical scars   Psychiatric: Normal affect, non-agitated, not confused       LABS:  N/A   RADS: CLINICAL DATA: Left-sided abdominal pain.  EXAM: CT ABDOMEN AND PELVIS WITH CONTRAST  TECHNIQUE: Multidetector CT imaging of the abdomen and pelvis was performed  using the standard protocol  following bolus administration of intravenous contrast.  CONTRAST: 100mL OMNIPAQUE IOHEXOL 300 MG/ML SOLN  COMPARISON: 11/04/2015  FINDINGS: Lower chest: Unremarkable  Hepatobiliary: No suspicious focal abnormality within the liver parenchyma. There is no evidence for gallstones, gallbladder wall thickening, or pericholecystic fluid. No intrahepatic or extrahepatic biliary dilation.  Pancreas: Pancreatic parenchymal atrophy noted without dilatation of the main duct.  Spleen: No splenomegaly. No focal mass lesion.  Adrenals/Urinary Tract: No adrenal nodule or mass. Right kidney unremarkable. Mild fullness of the left intrarenal collecting system is similar to prior 2016. There is no associated hydroureter. The urinary bladder appears normal for the degree of distention.  Stomach/Bowel: Stomach is unremarkable. No gastric wall thickening. No evidence of outlet obstruction. No small bowel wall thickening. No small bowel dilatation. The terminal ileum is normal. The appendix is not visualized, but there is no edema or inflammation in the region of the cecum. No gross colonic mass. No colonic wall thickening.  Vascular/Lymphatic: No abdominal aortic aneurysm. No abdominal aortic atherosclerotic calcification. There is no gastrohepatic or hepatoduodenal ligament lymphadenopathy. No intraperitoneal or retroperitoneal lymphadenopathy. No pelvic sidewall lymphadenopathy.  Reproductive: The uterus is surgically absent. There is no adnexal mass.  Other: No intraperitoneal free fluid.  Musculoskeletal: No worrisome lytic or sclerotic osseous abnormality. Small umbilical hernia contains only fat. There is a fascial defect in the left groin region consistent with hernia (axial 62/series 2). The hernia contains fat, likely mesentery, but no bowel. There is a tiny amount of fluid in the hernia sac. The hernia sac dissects cranially between the internal and external oblique muscles  and measures 8.8 x 8.2 x 1.8 cm. This hernia was present on the previous study but has progressed in the interval and the fluid in the hernia sac is new since that time period  IMPRESSION: 1. Left groin hernia identified containing fat and a small volume of fluid. The hernia sac dissects cranially up between the internal and external oblique muscles as described above. 2. Small fat containing umbilical hernia. 3. Otherwise unremarkable exam.   Electronically Signed  By: Eric Mansell M.D.  On: 08/12/2019 10:34 Assessment:       Ventral hernia without obstruction or gangrene [K43.9]  Although read states inguinal, pain is more LLQ, so possibly a ventral?  Psoriatic arthritis, on remicade.  Morbid obesity  COPD  Plan:   1. Ventral hernia without obstruction or gangrene [K43.9]   Discussed the risk of surgery including recurrence, which can be up to 50% in the case of incisional or complex hernias, possible use of prosthetic materials (mesh) and the increased risk of mesh infxn if used, bleeding, chronic pain, post-op infxn, post-op SBO or ileus, and possible re-operation to address said risks. The risks of general anesthetic, if used, includes MI, CVA, sudden death or even reaction to anesthetic medications also discussed. Alternatives include continued observation.  Benefits include possible symptom relief, prevention of incarceration, strangulation, enlargement in size over time, and the risk of emergency surgery in the face of strangulation.   Typical post-op recovery time of 3-5 days with 4-6 weeks of activity restrictions were also discussed.  ED return precautions given for sudden increase in pain, size of hernia with accompanying fever, nausea, and/or vomiting.  The patient verbalized understanding and all questions were answered to the patient's satisfaction.   2. Patient has elected to proceed with surgical treatment.  Chronic pain now for several weeks,  with no worsening, so although likely incarcerated, not strangulated at   this point.  No need for urgent repair but will proceed as soon as possible, based on her remicade infusion schedule Procedure will be scheduled.  Written consent was obtained.robotic assisted, with port placement in superior aspects We also discussed increased risk of perioperative complications and repair failure due to her weight and COPD, but likely not safe to wait until she loses some weight due to degree of symptoms, so will proceed with the increased risk.  She verbalized understanding.     Electronically signed by Sung Amabile, DO on 09/09/2019 7:49 AM

## 2019-09-09 NOTE — H&P (View-Only) (Signed)
Subjective:   CC: Ventral hernia without obstruction or gangrene [K43.9]  HPI:  Emily Fox is a 78 y.o. female who was referred by Gershon Mussel, NP for evaluation of above. Symptoms were first noted several months ago. Pain is sharp and intermittent, confined to the left lower quadrant, without radiation.  Associated with occasional nausea/emesis, exacerbated by constipation.  Lump is not reducible. Patient has no symptoms of  chronic constipation, chronic cough.    Past Medical History:  has a past medical history of Abdominal pain, recurrent (03/28/2018), Asthma without status asthmaticus, unspecified, B12 deficiency, COPD (chronic obstructive pulmonary disease) (CMS-HCC), FH: colon polyps (03/28/2018), GERD (gastroesophageal reflux disease), Hyperlipidemia, Hypertension, Impaired fasting glucose (06/2013), LLQ pain (08/10/2019), Osteoarthritis, Other irritable bowel syndrome (03/28/2018), Perennial allergic rhinitis, and Psoriatic arthritis (CMS-HCC).  Past Surgical History:       Past Surgical History:  Procedure Laterality Date  . COLONOSCOPY  01/19/2013, 08/20/2007, 02/04/1989   FH Colon Polyps (Son): CBF 01/2018; Recall Ltr mailed 12/24/2017 (dh)  . COLONOSCOPY  06/23/2018   FHCP (Sister) CBF 06/2023  . EGD  04/29/2009, 01/23/1999   No repeat per RTE  . HYSTERECTOMY    . KNEE ARTHROSCOPY Left   . Left total knee arthroplasty using computer-assisted navigation  12/05/2016   Dr Marry Guan  . Right medial unicompartmental knee arthroplasty  07/29/2013   Dr. Leanor Kail  . TONSILLECTOMY    . VEIN SURGERY Bilateral     Family History: family history includes Allergies in her son and son; Asthma in her daughter; Colon polyps in her son; Coronary Artery Disease (Blocked arteries around heart) in her mother; Diabetes type II in her brother, brother, and mother; Lung disease in her sister; Lupus in her brother; Myocardial Infarction (Heart attack) in her  mother.  Social History:  reports that she has never smoked. She has never used smokeless tobacco. She reports that she does not drink alcohol or use drugs.  Current Medications: has a current medication list which includes the following prescription(s): acetaminophen, albuterol, aspirin, calcium carbonate-vitamin d3, carvedilol, cetirizine, cholecalciferol, clindamycin, cyanocobalamin (vitamin b-12), estradiol, fluticasone propion-salmeterol, folic acid, gabapentin, hyoscyamine, infliximab, lactobac 40-bifido 3-s.thermop, methotrexate, montelukast, multivitamin, omeprazole, oxybutynin, temovate, torsemide, triamterene-hydrochlorothiazide, trolamine salicylate, and vitamin e.  Allergies:       Allergies as of 09/08/2019 - Reviewed 09/08/2019  Allergen Reaction Noted  . Shellfish containing products Shortness Of Breath 09/08/2019  . Synvisc [hylan g-f 20] Shortness Of Breath 02/25/2014  . Lisinopril Hallucination 10/30/2017  . Penicillins Swelling 02/25/2014    ROS:  A 15 point review of systems was performed and pertinent positives and negatives noted in HPI   Objective:   BP 159/76   Pulse 62   Ht 152.4 cm (5')   Wt 98.9 kg (218 lb)   BMI 42.58 kg/m   Constitutional :  alert, appears stated age, cooperative, no distress and moderately obese  Lymphatics/Throat:  no asymmetry, masses, or scars  Respiratory:  clear to auscultation bilaterally  Cardiovascular:  regular rate and rhythm  Gastrointestinal: focal tenderness over LLQ, no obvious palpable lump due to body habitus and limited exam due to TTP.   Musculoskeletal: Steady gait and movement  Skin: Cool and moist, no visible surgical scars   Psychiatric: Normal affect, non-agitated, not confused       LABS:  N/A   RADS: CLINICAL DATA: Left-sided abdominal pain.  EXAM: CT ABDOMEN AND PELVIS WITH CONTRAST  TECHNIQUE: Multidetector CT imaging of the abdomen and pelvis was performed  using the standard protocol  following bolus administration of intravenous contrast.  CONTRAST: 100mL OMNIPAQUE IOHEXOL 300 MG/ML SOLN  COMPARISON: 11/04/2015  FINDINGS: Lower chest: Unremarkable  Hepatobiliary: No suspicious focal abnormality within the liver parenchyma. There is no evidence for gallstones, gallbladder wall thickening, or pericholecystic fluid. No intrahepatic or extrahepatic biliary dilation.  Pancreas: Pancreatic parenchymal atrophy noted without dilatation of the main duct.  Spleen: No splenomegaly. No focal mass lesion.  Adrenals/Urinary Tract: No adrenal nodule or mass. Right kidney unremarkable. Mild fullness of the left intrarenal collecting system is similar to prior 2016. There is no associated hydroureter. The urinary bladder appears normal for the degree of distention.  Stomach/Bowel: Stomach is unremarkable. No gastric wall thickening. No evidence of outlet obstruction. No small bowel wall thickening. No small bowel dilatation. The terminal ileum is normal. The appendix is not visualized, but there is no edema or inflammation in the region of the cecum. No gross colonic mass. No colonic wall thickening.  Vascular/Lymphatic: No abdominal aortic aneurysm. No abdominal aortic atherosclerotic calcification. There is no gastrohepatic or hepatoduodenal ligament lymphadenopathy. No intraperitoneal or retroperitoneal lymphadenopathy. No pelvic sidewall lymphadenopathy.  Reproductive: The uterus is surgically absent. There is no adnexal mass.  Other: No intraperitoneal free fluid.  Musculoskeletal: No worrisome lytic or sclerotic osseous abnormality. Small umbilical hernia contains only fat. There is a fascial defect in the left groin region consistent with hernia (axial 62/series 2). The hernia contains fat, likely mesentery, but no bowel. There is a tiny amount of fluid in the hernia sac. The hernia sac dissects cranially between the internal and external oblique muscles  and measures 8.8 x 8.2 x 1.8 cm. This hernia was present on the previous study but has progressed in the interval and the fluid in the hernia sac is new since that time period  IMPRESSION: 1. Left groin hernia identified containing fat and a small volume of fluid. The hernia sac dissects cranially up between the internal and external oblique muscles as described above. 2. Small fat containing umbilical hernia. 3. Otherwise unremarkable exam.   Electronically Signed  By: Kennith CenterEric Mansell M.D.  On: 08/12/2019 10:34 Assessment:       Ventral hernia without obstruction or gangrene [K43.9]  Although read states inguinal, pain is more LLQ, so possibly a ventral?  Psoriatic arthritis, on remicade.  Morbid obesity  COPD  Plan:   1. Ventral hernia without obstruction or gangrene [K43.9]   Discussed the risk of surgery including recurrence, which can be up to 50% in the case of incisional or complex hernias, possible use of prosthetic materials (mesh) and the increased risk of mesh infxn if used, bleeding, chronic pain, post-op infxn, post-op SBO or ileus, and possible re-operation to address said risks. The risks of general anesthetic, if used, includes MI, CVA, sudden death or even reaction to anesthetic medications also discussed. Alternatives include continued observation.  Benefits include possible symptom relief, prevention of incarceration, strangulation, enlargement in size over time, and the risk of emergency surgery in the face of strangulation.   Typical post-op recovery time of 3-5 days with 4-6 weeks of activity restrictions were also discussed.  ED return precautions given for sudden increase in pain, size of hernia with accompanying fever, nausea, and/or vomiting.  The patient verbalized understanding and all questions were answered to the patient's satisfaction.   2. Patient has elected to proceed with surgical treatment.  Chronic pain now for several weeks,  with no worsening, so although likely incarcerated, not strangulated at  this point.  No need for urgent repair but will proceed as soon as possible, based on her remicade infusion schedule Procedure will be scheduled.  Written consent was obtained.robotic assisted, with port placement in superior aspects We also discussed increased risk of perioperative complications and repair failure due to her weight and COPD, but likely not safe to wait until she loses some weight due to degree of symptoms, so will proceed with the increased risk.  She verbalized understanding.     Electronically signed by Sung Amabile, DO on 09/09/2019 7:49 AM

## 2019-09-24 ENCOUNTER — Other Ambulatory Visit: Payer: Self-pay

## 2019-09-24 ENCOUNTER — Encounter
Admission: RE | Admit: 2019-09-24 | Discharge: 2019-09-24 | Disposition: A | Discharge status: Home / Self Care | Payer: Medicare Other | Source: Ambulatory Visit | Attending: Surgery | Admitting: Surgery

## 2019-09-24 DIAGNOSIS — Z01818 Encounter for other preprocedural examination: Secondary | ICD-10-CM | POA: Insufficient documentation

## 2019-09-24 DIAGNOSIS — R9431 Abnormal electrocardiogram [ECG] [EKG]: Secondary | ICD-10-CM | POA: Insufficient documentation

## 2019-09-24 DIAGNOSIS — I452 Bifascicular block: Secondary | ICD-10-CM | POA: Diagnosis not present

## 2019-09-24 DIAGNOSIS — I4510 Unspecified right bundle-branch block: Secondary | ICD-10-CM | POA: Insufficient documentation

## 2019-09-24 DIAGNOSIS — I10 Essential (primary) hypertension: Secondary | ICD-10-CM | POA: Insufficient documentation

## 2019-09-24 NOTE — Patient Instructions (Signed)
Your procedure is scheduled on: Friday 10/02/19.  Report to DAY SURGERY DEPARTMENT LOCATED ON 2ND FLOOR MEDICAL MALL ENTRANCE. To find out your arrival time please call [phone] between 1PM - 3PM on Thursday 10/01/19.   Remember: Instructions that are not followed completely may result in serious medical risk, up to and including death, or upon the discretion of your surgeon and anesthesiologist your surgery may need to be rescheduled.      _X__ 1. Do not eat food after midnight the night before your procedure.                 No gum chewing or hard candies. You may drink clear liquids up to 2 hours                 before you are scheduled to arrive for your surgery- DO NOT drink clear                 liquids within 2 hours of the start of your surgery.                 Clear Liquids include:  water, apple juice without pulp, clear carbohydrate                 drink such as Clearfast or Gatorade, Black Coffee or Tea (Do not add                 milk or creamer to coffee or tea).    __X__2.  On the morning of surgery brush your teeth with toothpaste and water, you may rinse your mouth with mouthwash if you wish.  Do not swallow any toothpaste or mouthwash.      __X__6.  Notify your doctor if there is any change in your medical condition      (cold, fever, infections).       Do not wear jewelry, make-up, hairpins, clips or nail polish. Do not wear lotions, powders, or perfumes.  Do not shave 48 hours prior to surgery. Men may shave face and neck. Do not bring valuables to the hospital.    [facility] is not responsible for any belongings or valuables.  Contacts, dentures/partials or body piercings may not be worn into surgery. Bring a case for your contacts, glasses or hearing aids, a denture cup will be supplied.     Patients discharged the day of surgery will not be allowed to drive home.     Please read over the following fact sheets that you were given:   MRSA  Information    __X__ Take these medicines the morning of surgery with A SIP OF WATER:     1. albuterol (PROAIR HFA) 108 (90 BASE) MCG/ACT inhaler  2. fluticasone-salmeterol (ADVAIR HFA) 115-21 MCG/ACT inhaler  3. fluticasone (FLONASE) 50 MCG/ACT nasal spray  4. cetirizine (ZYRTEC) 10 MG tablet  5. carvedilol (COREG) 6.25 MG tablet  6. omeprazole (PRILOSEC) 40 MG capsule     __X__ Use CHG Soap as directed    _ X___ Use inhalers on the day of surgery. Also bring the inhaler with you to the hospital on the morning of surgery.     __X__ Stop Blood Thinners: Aspirin 3 days before your procedure. Your last dose will be on Monday 09/28/19.    __X__ Stop Anti-inflammatories 7 days before surgery such as Advil, Ibuprofen, Motrin, BC or Goodies Powder, Naprosyn, Naproxen, Aleve, Aspirin, Meloxicam. May take Tylenol if needed for pain or discomfort.  __X__ Stop the following herbal supplements today:  Cranberry-Vitamin C-Vitamin E (CRANBERRY PLUS VITAMIN C) 4200-20-3 MG-MG-UNIT CAPS  vitamin E (VITAMIN E) 400 UNIT capsule

## 2019-09-24 NOTE — Pre-Procedure Instructions (Addendum)
Pre-Admit Testing Provider Communication Note  Provider: Dr. Kayleen Memos, Anesthesiologist  Notification Mode: Secure Chat  Reason: Abnormal EKG. "Good morning Dr. Kayleen Memos. This patient is scheduled for ventral hernia repair with Dr. Lysle Pearl on 11/13. The EKG today shows changes from the one 11/21/16. However, when looking at the one from 05/04/13, it is the exact same as today. Can you advise?"   Response: "Emily Fox, can we just send the EKG to her primary care MD for review. Otherwise it should not be an issue if she had the same looking EKG in 2014"   Additional Information: I have reached out to Dr. Tonette Bihari office. Awaiting return call.   Update @ 1516: Request for Clearance faxed to Dr. Ouida Sills including a copy of the EKG from today. Faxed also to Dr. Lysle Pearl for [facility]. Fax confirmation received from both offices.   Signed: Beulah Gandy, RN

## 2019-09-29 ENCOUNTER — Other Ambulatory Visit
Admission: RE | Admit: 2019-09-29 | Discharge: 2019-09-29 | Disposition: A | Discharge status: Home / Self Care | Payer: Medicare Other | Source: Ambulatory Visit | Attending: Surgery | Admitting: Surgery

## 2019-09-29 ENCOUNTER — Other Ambulatory Visit: Payer: Self-pay

## 2019-09-29 DIAGNOSIS — Z01812 Encounter for preprocedural laboratory examination: Secondary | ICD-10-CM | POA: Diagnosis present

## 2019-09-29 DIAGNOSIS — Z20828 Contact with and (suspected) exposure to other viral communicable diseases: Secondary | ICD-10-CM | POA: Diagnosis not present

## 2019-09-29 LAB — SARS CORONAVIRUS 2 (TAT 6-24 HRS): SARS Coronavirus 2: NEGATIVE

## 2019-09-30 NOTE — Pre-Procedure Instructions (Signed)
Per Gregary Signs at Dr Tonette Bihari office no change in EKG patient is OK to proceed with surgery. See telephone notes in Care Everywhere 09/29/19. Copy attached to chart.

## 2019-10-01 MED ORDER — CLINDAMYCIN PHOSPHATE 900 MG/50ML IV SOLN
900.0000 mg | INTRAVENOUS | Status: AC
  Administered 2019-10-02: 13:00:00 900 mg via INTRAVENOUS

## 2019-10-02 ENCOUNTER — Encounter
Admission: AD | Disposition: A | Discharge status: Home / Self Care | Payer: Self-pay | Source: Home / Self Care | Attending: Surgery

## 2019-10-02 ENCOUNTER — Encounter: Payer: Self-pay | Admitting: *Deleted

## 2019-10-02 ENCOUNTER — Ambulatory Visit: Payer: Medicare Other | Admitting: Anesthesiology

## 2019-10-02 ENCOUNTER — Other Ambulatory Visit: Payer: Self-pay

## 2019-10-02 ENCOUNTER — Ambulatory Visit
Admission: AD | Admit: 2019-10-02 | Discharge: 2019-10-02 | Disposition: A | Discharge status: Home / Self Care | Payer: Medicare Other | Attending: Surgery | Admitting: Surgery

## 2019-10-02 DIAGNOSIS — K219 Gastro-esophageal reflux disease without esophagitis: Secondary | ICD-10-CM | POA: Diagnosis not present

## 2019-10-02 DIAGNOSIS — Z7951 Long term (current) use of inhaled steroids: Secondary | ICD-10-CM | POA: Diagnosis not present

## 2019-10-02 DIAGNOSIS — Z87891 Personal history of nicotine dependence: Secondary | ICD-10-CM | POA: Insufficient documentation

## 2019-10-02 DIAGNOSIS — I10 Essential (primary) hypertension: Secondary | ICD-10-CM | POA: Insufficient documentation

## 2019-10-02 DIAGNOSIS — Z96653 Presence of artificial knee joint, bilateral: Secondary | ICD-10-CM | POA: Insufficient documentation

## 2019-10-02 DIAGNOSIS — J449 Chronic obstructive pulmonary disease, unspecified: Secondary | ICD-10-CM | POA: Insufficient documentation

## 2019-10-02 DIAGNOSIS — J309 Allergic rhinitis, unspecified: Secondary | ICD-10-CM | POA: Diagnosis not present

## 2019-10-02 DIAGNOSIS — K4090 Unilateral inguinal hernia, without obstruction or gangrene, not specified as recurrent: Secondary | ICD-10-CM | POA: Diagnosis present

## 2019-10-02 DIAGNOSIS — Z6838 Body mass index (BMI) 38.0-38.9, adult: Secondary | ICD-10-CM | POA: Insufficient documentation

## 2019-10-02 DIAGNOSIS — Z79899 Other long term (current) drug therapy: Secondary | ICD-10-CM | POA: Diagnosis not present

## 2019-10-02 DIAGNOSIS — Z7982 Long term (current) use of aspirin: Secondary | ICD-10-CM | POA: Diagnosis not present

## 2019-10-02 DIAGNOSIS — Z79890 Hormone replacement therapy (postmenopausal): Secondary | ICD-10-CM | POA: Insufficient documentation

## 2019-10-02 DIAGNOSIS — L4050 Arthropathic psoriasis, unspecified: Secondary | ICD-10-CM | POA: Diagnosis not present

## 2019-10-02 HISTORY — PX: XI ROBOTIC ASSISTED VENTRAL HERNIA: SHX6789

## 2019-10-02 SURGERY — REPAIR, HERNIA, VENTRAL, ROBOT-ASSISTED
Anesthesia: General | Site: Abdomen

## 2019-10-02 MED ORDER — SODIUM CHLORIDE FLUSH 0.9 % IV SOLN
INTRAVENOUS | Status: AC
  Filled 2019-10-02: qty 10

## 2019-10-02 MED ORDER — LIDOCAINE HCL (CARDIAC) PF 100 MG/5ML IV SOSY
PREFILLED_SYRINGE | INTRAVENOUS | Status: DC | PRN
  Administered 2019-10-02: 100 mg via INTRAVENOUS

## 2019-10-02 MED ORDER — ROCURONIUM BROMIDE 100 MG/10ML IV SOLN
INTRAVENOUS | Status: DC | PRN
  Administered 2019-10-02: 10 mg via INTRAVENOUS
  Administered 2019-10-02: 30 mg via INTRAVENOUS

## 2019-10-02 MED ORDER — PHENYLEPHRINE HCL (PRESSORS) 10 MG/ML IV SOLN
INTRAVENOUS | Status: DC | PRN
  Administered 2019-10-02: 100 ug via INTRAVENOUS

## 2019-10-02 MED ORDER — LIDOCAINE HCL (PF) 2 % IJ SOLN
INTRAMUSCULAR | Status: AC
  Filled 2019-10-02: qty 10

## 2019-10-02 MED ORDER — ACETAMINOPHEN 500 MG PO TABS
1000.0000 mg | ORAL_TABLET | ORAL | Status: AC
  Administered 2019-10-02: 13:00:00 1000 mg via ORAL

## 2019-10-02 MED ORDER — HYDROCODONE-ACETAMINOPHEN 5-325 MG PO TABS: 1.0000 | ORAL_TABLET | Freq: Four times a day (QID) | ORAL | 0 refills | Status: DC | PRN

## 2019-10-02 MED ORDER — SUCCINYLCHOLINE CHLORIDE 20 MG/ML IJ SOLN
INTRAMUSCULAR | Status: DC | PRN
  Administered 2019-10-02: 100 mg via INTRAVENOUS

## 2019-10-02 MED ORDER — SUGAMMADEX SODIUM 200 MG/2ML IV SOLN
INTRAVENOUS | Status: DC | PRN
  Administered 2019-10-02: 197.8 mg via INTRAVENOUS

## 2019-10-02 MED ORDER — SUGAMMADEX SODIUM 200 MG/2ML IV SOLN
INTRAVENOUS | Status: AC
  Filled 2019-10-02: qty 2

## 2019-10-02 MED ORDER — CELECOXIB 200 MG PO CAPS
200.0000 mg | ORAL_CAPSULE | ORAL | Status: AC
  Administered 2019-10-02: 13:00:00 200 mg via ORAL

## 2019-10-02 MED ORDER — PROPOFOL 10 MG/ML IV BOLUS
INTRAVENOUS | Status: AC
  Filled 2019-10-02: qty 20

## 2019-10-02 MED ORDER — ONDANSETRON HCL 4 MG/2ML IJ SOLN
INTRAMUSCULAR | Status: AC
  Filled 2019-10-02: qty 2

## 2019-10-02 MED ORDER — DOCUSATE SODIUM 100 MG PO CAPS: 100.0000 mg | ORAL_CAPSULE | Freq: Two times a day (BID) | ORAL | 0 refills | Status: AC | PRN

## 2019-10-02 MED ORDER — FENTANYL CITRATE (PF) 100 MCG/2ML IJ SOLN
INTRAMUSCULAR | Status: DC | PRN
  Administered 2019-10-02 (×2): 50 ug via INTRAVENOUS

## 2019-10-02 MED ORDER — FENTANYL CITRATE (PF) 100 MCG/2ML IJ SOLN
INTRAMUSCULAR | Status: AC
  Filled 2019-10-02: qty 2

## 2019-10-02 MED ORDER — SODIUM CHLORIDE (PF) 0.9 % IJ SOLN
INTRAMUSCULAR | Status: AC
  Filled 2019-10-02: qty 50

## 2019-10-02 MED ORDER — BUPIVACAINE LIPOSOME 1.3 % IJ SUSP
INTRAMUSCULAR | Status: DC | PRN
  Administered 2019-10-02: 20 mL

## 2019-10-02 MED ORDER — GABAPENTIN 300 MG PO CAPS
300.0000 mg | ORAL_CAPSULE | ORAL | Status: AC
  Administered 2019-10-02: 13:00:00 300 mg via ORAL

## 2019-10-02 MED ORDER — DEXAMETHASONE SODIUM PHOSPHATE 10 MG/ML IJ SOLN
INTRAMUSCULAR | Status: AC
  Filled 2019-10-02: qty 1

## 2019-10-02 MED ORDER — CHLORHEXIDINE GLUCONATE CLOTH 2 % EX PADS: 6.0000 | MEDICATED_PAD | Freq: Once | CUTANEOUS | Status: DC

## 2019-10-02 MED ORDER — CLINDAMYCIN PHOSPHATE 900 MG/50ML IV SOLN
INTRAVENOUS | Status: AC
  Filled 2019-10-02: qty 50

## 2019-10-02 MED ORDER — LIDOCAINE 2% (20 MG/ML) 5 ML SYRINGE
INTRAMUSCULAR | Status: DC | PRN
  Administered 2019-10-02: 1.5 mg/kg/h via INTRAVENOUS

## 2019-10-02 MED ORDER — CELECOXIB 200 MG PO CAPS
ORAL_CAPSULE | ORAL | Status: AC
  Administered 2019-10-02: 13:00:00 200 mg via ORAL
  Filled 2019-10-02: qty 1

## 2019-10-02 MED ORDER — ONDANSETRON HCL 4 MG/2ML IJ SOLN
INTRAMUSCULAR | Status: DC | PRN
  Administered 2019-10-02: 4 mg via INTRAVENOUS

## 2019-10-02 MED ORDER — ONDANSETRON HCL 4 MG/2ML IJ SOLN: 4.0000 mg | Freq: Once | INTRAMUSCULAR | Status: DC | PRN

## 2019-10-02 MED ORDER — BUPIVACAINE HCL (PF) 0.5 % IJ SOLN
INTRAMUSCULAR | Status: AC
  Filled 2019-10-02: qty 30

## 2019-10-02 MED ORDER — SUCCINYLCHOLINE CHLORIDE 20 MG/ML IJ SOLN
INTRAMUSCULAR | Status: AC
  Filled 2019-10-02: qty 1

## 2019-10-02 MED ORDER — EPINEPHRINE PF 1 MG/ML IJ SOLN
INTRAMUSCULAR | Status: AC
  Filled 2019-10-02: qty 1

## 2019-10-02 MED ORDER — LACTATED RINGERS IV SOLN
INTRAVENOUS | Status: DC
  Administered 2019-10-02: 13:00:00 via INTRAVENOUS

## 2019-10-02 MED ORDER — GABAPENTIN 300 MG PO CAPS
ORAL_CAPSULE | ORAL | Status: AC
  Administered 2019-10-02: 13:00:00 300 mg via ORAL
  Filled 2019-10-02: qty 1

## 2019-10-02 MED ORDER — PROPOFOL 10 MG/ML IV BOLUS
INTRAVENOUS | Status: DC | PRN
  Administered 2019-10-02: 140 mg via INTRAVENOUS

## 2019-10-02 MED ORDER — ACETAMINOPHEN 325 MG PO TABS: 650.0000 mg | ORAL_TABLET | Freq: Three times a day (TID) | ORAL | 0 refills | Status: AC | PRN

## 2019-10-02 MED ORDER — FENTANYL CITRATE (PF) 100 MCG/2ML IJ SOLN
25.0000 ug | INTRAMUSCULAR | Status: DC | PRN
  Administered 2019-10-02: 17:00:00 25 ug via INTRAVENOUS

## 2019-10-02 MED ORDER — BUPIVACAINE LIPOSOME 1.3 % IJ SUSP
INTRAMUSCULAR | Status: AC
  Filled 2019-10-02: qty 20

## 2019-10-02 MED ORDER — IBUPROFEN 800 MG PO TABS: 800.0000 mg | ORAL_TABLET | Freq: Three times a day (TID) | ORAL | 0 refills | Status: AC | PRN

## 2019-10-02 MED ORDER — LIDOCAINE HCL (PF) 2 % IJ SOLN
INTRAMUSCULAR | Status: AC
  Filled 2019-10-02: qty 30

## 2019-10-02 MED ORDER — BUPIVACAINE-EPINEPHRINE 0.5% -1:200000 IJ SOLN
INTRAMUSCULAR | Status: DC | PRN
  Administered 2019-10-02: 10 mL
  Administered 2019-10-02: 20 mL

## 2019-10-02 MED ORDER — ACETAMINOPHEN 500 MG PO TABS
ORAL_TABLET | ORAL | Status: AC
  Administered 2019-10-02: 13:00:00 1000 mg via ORAL
  Filled 2019-10-02: qty 2

## 2019-10-02 MED ORDER — ROCURONIUM BROMIDE 50 MG/5ML IV SOLN
INTRAVENOUS | Status: AC
  Filled 2019-10-02: qty 1

## 2019-10-02 MED ORDER — DEXAMETHASONE SODIUM PHOSPHATE 10 MG/ML IJ SOLN
INTRAMUSCULAR | Status: DC | PRN
  Administered 2019-10-02: 10 mg via INTRAVENOUS

## 2019-10-02 MED ORDER — FENTANYL CITRATE (PF) 100 MCG/2ML IJ SOLN
INTRAMUSCULAR | Status: AC
  Administered 2019-10-02: 17:00:00 25 ug via INTRAVENOUS
  Filled 2019-10-02: qty 2

## 2019-10-02 SURGICAL SUPPLY — 57 items
BLADE SURG SZ11 CARB STEEL (BLADE) ×3 IMPLANT
CANISTER SUCT 1200ML W/VALVE (MISCELLANEOUS) ×3 IMPLANT
CANNULA REDUC XI 12-8 STAPL (CANNULA) ×1
CANNULA REDUC XI 12-8MM STAPL (CANNULA) ×1
CANNULA REDUCER 12-8 DVNC XI (CANNULA) ×1 IMPLANT
CHLORAPREP W/TINT 26 (MISCELLANEOUS) ×3 IMPLANT
COVER TIP SHEARS 8 DVNC (MISCELLANEOUS) ×1 IMPLANT
COVER TIP SHEARS 8MM DA VINCI (MISCELLANEOUS) ×2
COVER WAND RF STERILE (DRAPES) ×3 IMPLANT
DEFOGGER SCOPE WARMER CLEARIFY (MISCELLANEOUS) ×3 IMPLANT
DERMABOND ADVANCED (GAUZE/BANDAGES/DRESSINGS) ×2
DERMABOND ADVANCED .7 DNX12 (GAUZE/BANDAGES/DRESSINGS) ×1 IMPLANT
DRAPE 3/4 80X56 (DRAPES) ×3 IMPLANT
DRAPE ARM DVNC X/XI (DISPOSABLE) ×4 IMPLANT
DRAPE COLUMN DVNC XI (DISPOSABLE) ×1 IMPLANT
DRAPE DA VINCI XI ARM (DISPOSABLE) ×8
DRAPE DA VINCI XI COLUMN (DISPOSABLE) ×2
ELECT CAUTERY BLADE 6.4 (BLADE) ×3 IMPLANT
ELECT REM PT RETURN 9FT ADLT (ELECTROSURGICAL) ×3
ELECTRODE REM PT RTRN 9FT ADLT (ELECTROSURGICAL) ×1 IMPLANT
ETHIBOND 2 0 GREEN CT 2 30IN (SUTURE) ×6 IMPLANT
GLOVE BIOGEL PI IND STRL 7.0 (GLOVE) ×1 IMPLANT
GLOVE BIOGEL PI INDICATOR 7.0 (GLOVE) ×2
GLOVE SURG SYN 6.5 ES PF (GLOVE) ×3 IMPLANT
GOWN STRL REUS W/ TWL LRG LVL3 (GOWN DISPOSABLE) ×3 IMPLANT
GOWN STRL REUS W/TWL LRG LVL3 (GOWN DISPOSABLE) ×6
GRASPER SUT TROCAR 14GX15 (MISCELLANEOUS) ×3 IMPLANT
IRRIGATOR SUCT 8 DISP DVNC XI (IRRIGATION / IRRIGATOR) IMPLANT
IRRIGATOR SUCTION 8MM XI DISP (IRRIGATION / IRRIGATOR)
IV NS 1000ML (IV SOLUTION)
IV NS 1000ML BAXH (IV SOLUTION) IMPLANT
KIT PINK PAD W/HEAD ARE REST (MISCELLANEOUS) ×3
KIT PINK PAD W/HEAD ARM REST (MISCELLANEOUS) ×1 IMPLANT
LABEL OR SOLS (LABEL) ×3 IMPLANT
MESH 3DMAX 4X6 LT LRG (Mesh General) ×2 IMPLANT
MESH 3DMAX MID 4X6 LT LRG (Mesh General) ×1 IMPLANT
NEEDLE HYPO 22GX1.5 SAFETY (NEEDLE) ×3 IMPLANT
NEEDLE VERESS 14GA 120MM (NEEDLE) ×3 IMPLANT
OBTURATOR OPTICAL STANDARD 8MM (TROCAR) ×2
OBTURATOR OPTICAL STND 8 DVNC (TROCAR) ×1
OBTURATOR OPTICALSTD 8 DVNC (TROCAR) ×1 IMPLANT
PACK LAP CHOLECYSTECTOMY (MISCELLANEOUS) ×3 IMPLANT
PENCIL ELECTRO HAND CTR (MISCELLANEOUS) ×3 IMPLANT
SEAL CANN UNIV 5-8 DVNC XI (MISCELLANEOUS) ×2 IMPLANT
SEAL XI 5MM-8MM UNIVERSAL (MISCELLANEOUS) ×4
SOLUTION ELECTROLUBE (MISCELLANEOUS) ×3 IMPLANT
STAPLER CANNULA SEAL DVNC XI (STAPLE) ×1 IMPLANT
STAPLER CANNULA SEAL XI (STAPLE) ×2
SUT DVC VLOC 3-0 CL 6 P-12 (SUTURE) ×9 IMPLANT
SUT MNCRL AB 4-0 PS2 18 (SUTURE) ×3 IMPLANT
SUT VIC AB 3-0 SH 27 (SUTURE) ×2
SUT VIC AB 3-0 SH 27X BRD (SUTURE) ×1 IMPLANT
SUT VICRYL 0 AB UR-6 (SUTURE) ×3 IMPLANT
SYR 30ML LL (SYRINGE) ×3 IMPLANT
TRAY FOLEY MTR SLVR 16FR STAT (SET/KITS/TRAYS/PACK) ×3 IMPLANT
TROCAR XCEL NON-BLD 5MMX100MML (ENDOMECHANICALS) ×3 IMPLANT
TUBING EVAC SMOKE HEATED PNEUM (TUBING) ×3 IMPLANT

## 2019-10-02 NOTE — Anesthesia Postprocedure Evaluation (Signed)
Anesthesia Post Note  Patient: Emily Fox  Procedure(s) Performed: XI ROBOTIC ASSISTED VENTRAL HERNIA (N/A Abdomen)  Patient location during evaluation: PACU Anesthesia Type: General Level of consciousness: awake and alert Pain management: pain level controlled Vital Signs Assessment: post-procedure vital signs reviewed and stable Respiratory status: spontaneous breathing and respiratory function stable Cardiovascular status: stable Anesthetic complications: no     Last Vitals:  Vitals:   10/02/19 1712 10/02/19 1728  BP: 128/73 (!) 143/64  Pulse: (!) 58 (!) 59  Resp: 17 18  Temp: (!) 36.2 C 36.9 C  SpO2: 94% 99%    Last Pain:  Vitals:   10/02/19 1728  TempSrc: Temporal  PainSc: 0-No pain                 Demarquez Ciolek K

## 2019-10-02 NOTE — Interval H&P Note (Signed)
History and Physical Interval Note:  10/02/2019 12:53 PM  Emily Fox  has presented today for surgery, with the diagnosis of K43.9 Ventral hernia without obstruction or gangrene.  The various methods of treatment have been discussed with the patient and family. After consideration of risks, benefits and other options for treatment, the patient has consented to  Procedure(s): XI ROBOTIC [place] (N/A) as a surgical intervention.  The patient's history has been reviewed, patient examined, no change in status, stable for surgery.  I have reviewed the patient's chart and labs.  Questions were answered to the patient's satisfaction.     Toben Acuna Lysle Pearl

## 2019-10-02 NOTE — Anesthesia Procedure Notes (Signed)
Procedure Name: Intubation Date/Time: 10/02/2019 1:19 PM Performed by: Rona Ravens, CRNA Pre-anesthesia Checklist: Patient identified, Emergency Drugs available, Suction available, Patient being monitored and Timeout performed Patient Re-evaluated:Patient Re-evaluated prior to induction Oxygen Delivery Method: Circle system utilized Preoxygenation: Pre-oxygenation with 100% oxygen Induction Type: IV induction Laryngoscope Size: Mac and 3 Grade View: Grade I Tube type: Oral Tube size: 7.0 mm Number of attempts: 1 Airway Equipment and Method: Stylet Placement Confirmation: ETT inserted through vocal cords under direct vision,  positive ETCO2,  CO2 detector and breath sounds checked- equal and bilateral Secured at: 22 cm Tube secured with: Tape

## 2019-10-02 NOTE — Anesthesia Preprocedure Evaluation (Signed)
Anesthesia Evaluation  Patient identified by MRN, date of birth, ID band Patient awake    Reviewed: Allergy & Precautions  History of Anesthesia Complications (+) PROLONGED EMERGENCE and history of anesthetic complications  Airway Mallampati: II  TM Distance: >3 FB     Dental  (+) Chipped, Poor Dentition   Pulmonary asthma , COPD, former smoker,    Pulmonary exam normal        Cardiovascular hypertension, Normal cardiovascular exam     Neuro/Psych negative neurological ROS  negative psych ROS   GI/Hepatic Neg liver ROS, GERD  ,  Endo/Other  negative endocrine ROS  Renal/GU negative Renal ROS Bladder dysfunction      Musculoskeletal  (+) Arthritis , Osteoarthritis,    Abdominal Normal abdominal exam  (+)   Peds negative pediatric ROS (+)  Hematology negative hematology ROS (+)   Anesthesia Other Findings   Reproductive/Obstetrics                             Anesthesia Physical Anesthesia Plan  ASA: II  Anesthesia Plan: General   Post-op Pain Management:    Induction: Intravenous  PONV Risk Score and Plan:   Airway Management Planned: Oral ETT  Additional Equipment:   Intra-op Plan:   Post-operative Plan: Extubation in OR  Informed Consent: I have reviewed the patients History and Physical, chart, labs and discussed the procedure including the risks, benefits and alternatives for the proposed anesthesia with the patient or authorized representative who has indicated his/her understanding and acceptance.     Dental advisory given  Plan Discussed with: CRNA and Surgeon  Anesthesia Plan Comments:         Anesthesia Quick Evaluation

## 2019-10-02 NOTE — Anesthesia Post-op Follow-up Note (Signed)
Anesthesia QCDR form completed.        

## 2019-10-02 NOTE — Op Note (Signed)
Preoperative diagnosis: Left inguinal Hernia, incarcerated  Postoperative diagnosis:  same Procedure: Robotic assisted laparoscopic left inguinal hernia repair with mesh  Anesthesia: General  Surgeon: Dr. Lysle Pearl  Wound Classification: Clean  Specimen: none  Complications: None  Estimated Blood Loss: 41mL   Indications:  inguinal hernia. Repair was indicated to avoid complications of incarceration, obstruction and pain, and a prosthetic mesh repair was elected.  See H&P for further details.  Findings: 1.  2. Bard 3D medium mesh used for repair 3. Adequate hemostasis achieved  Description of procedure: The patient was taken to the operating room. A time-out was completed verifying correct patient, procedure, site, positioning, and implant(s) and/or special equipment prior to beginning this procedure.  Area prepped and draped in the usual sterile fashion. preop abx given. Foley catheter placed.  Veress needle inserted at palmer's point.  Saline drop test noted to be positive with gradual increase in pressure after initiation of gas insufflation.  15 mm of pressure was achieved prior to removing the Veress needle and then placing a 8 mm port via the Optiview technique through palmer's point after infusion of local anesthesia.  Inspection of the area afterwards noted no injury to the surrounding organs during insertion of the needle and the port.  2 port sites were marked 8 cm to the lateral sides of the initial port, and a 8 mm robotic port was placed on the left side, another 8 mm robotic port on the right side under direct supervision.  Local anesthesia  infused to the preplanned incision site prior to insertion of the port.  The [place] was then brought into the operative field and docked to the ports.  Examination of the abdominal cavity noted a left inguinal hernia.  A peritoneal flap was created approximately 8cm cephalad to the defect by using scissors with  electrocautery.  Dissection was carried down towards the pubic tubercle, developing the myopectineal orifice view.  Laterally the flap was carried towards the ASIS.  Large fat hernia contents noted, which carefully dissected away from the adjacent tissues to be fully reduced out of hernia cavity.  Any bleeding was controlled with combination of electrocautery and manual pressure.    After confirming adequate dissection and the peritoneal reflection completely down and away from the deftct, a Bard 3DMax  meduim weight large mesh was placed within the anterior abdominal wall, secured in place using 2-0 Vicryl on an SH needle immediately above the pubic tubercle.  After noting proper placement of the mesh with the peritoneal reflection deep to it, the previously created peritoneal flap was secured back up to the anterior abdominal wall using running 3-0 V-Lock. Due to to the thin layer, several holes were noted within the flap.  These were covered by the redundant hernia sac by securing the sac over the defects and to the abdominal wall with an additional 3-0 V-lock in a running fashion.  All defects noted to be covered and sac under minimal tension in the end. All needles were then removed out of the abdominal cavity, Xi platform undocked from the ports and removed off of operative field.  exparel infused as ilioinguinal block.  Abdomen then desufflated and ports removed. All the skin incisions were then closed with a subcuticular stitch of Monocryl 4-0. Dermabond was applied. Foley catheter removed. The patient tolerated the procedure well and was taken to the postanesthesia care unit in stable condition. Sponge and instrument count correct at end of procedure.

## 2019-10-02 NOTE — Discharge Instructions (Addendum)
Hernia repair, Care After This sheet gives you information about how to care for yourself after your procedure. Your health care provider may also give you more specific instructions. If you have problems or questions, contact your health care provider. What can I expect after the procedure? After your procedure, it is common to have the following:  Pain in your abdomen, especially in the incision areas. You will be given medicine to control the pain.  Tiredness. This is a normal part of the recovery process. Your energy level will return to normal over the next several weeks.  Changes in your bowel movements, such as constipation or needing to go more often. Talk with your health care provider about how to manage this. Follow these instructions at home: Medicines   tylenol and advil as needed for discomfort.  Please alternate between the two every four hours as needed for pain.     Use narcotics, if prescribed, only when tylenol and motrin is not enough to control pain.   325-650mg  every 8hrs to max of 3000mg /24hrs (including the 325mg  in every norco dose) for the tylenol.     Advil up to 800mg  per dose every 8hrs as needed for pain.    OK TO RESUME ASPIRIN IN 72HRS  PLEASE RECORD NUMBER OF PILLS TAKEN UNTIL NEXT FOLLOW UP APPT.  THIS WILL HELP DETERMINE HOW READY YOU ARE TO BE RELEASED FROM ANY ACTIVITY RESTRICTIONS  Do not drive or use heavy machinery while taking prescription pain medicine.  Do not drink alcohol while taking prescription pain medicine.  Incision care     Follow instructions from your health care provider about how to take care of your incision areas. Make sure you: ? Keep your incisions clean and dry. ? Wash your hands with soap and water before and after applying medicine to the areas, and before and after changing your bandage (dressing). If soap and water are not available, use hand sanitizer. ? Change your dressing as told by your health care  provider. ? Leave stitches (sutures), skin glue, or adhesive strips in place. These skin closures may need to stay in place for 2 weeks or longer. If adhesive strip edges start to loosen and curl up, you may trim the loose edges. Do not remove adhesive strips completely unless your health care provider tells you to do that.  Do not wear tight clothing over the incisions. Tight clothing may rub and irritate the incision areas, which may cause the incisions to open.  Do not take baths, swim, or use a hot tub until your health care provider approves. OK TO SHOWER IN 24HRS.    Check your incision area every day for signs of infection. Check for: ? More redness, swelling, or pain. ? More fluid or blood. ? Warmth. ? Pus or a bad smell. Activity  Avoid lifting anything that is heavier than 10 lb (4.5 kg) for 2 weeks or until your health care provider says it is okay.  No pushing/pulling greater than 30lbs  You may resume normal activities as told by your health care provider. Ask your health care provider what activities are safe for you.  Take rest breaks during the day as needed. Eating and drinking  Follow instructions from your health care provider about what you can eat after surgery.  To prevent or treat constipation while you are taking prescription pain medicine, your health care provider may recommend that you: ? Drink enough fluid to keep your urine clear or pale yellow. ?  Take over-the-counter or prescription medicines. ? Eat foods that are high in fiber, such as fresh fruits and vegetables, whole grains, and beans. ? Limit foods that are high in fat and processed sugars, such as fried and sweet foods. General instructions  Ask your health care provider when you will need an appointment to get your sutures or staples removed.  Keep all follow-up visits as told by your health care provider. This is important. Contact a health care provider if:  You have more redness, swelling,  or pain around your incisions.  You have more fluid or blood coming from the incisions.  Your incisions feel warm to the touch.  You have pus or a bad smell coming from your incisions or your dressing.  You have a fever.  You have an incision that breaks open (edges not staying together) after sutures or staples have been removed. Get help right away if:  You develop a rash.  You have chest pain or difficulty breathing.  You have pain or swelling in your legs.  You feel light-headed or you faint.  Your abdomen swells (becomes distended).  You have nausea or vomiting.  You have blood in your stool (feces). This information is not intended to replace advice given to you by your health care provider. Make sure you discuss any questions you have with your health care provider. Document Released: 05/25/2005 Document Revised: 07/25/2018 Document Reviewed: 08/06/2016 Elsevier Interactive Patient Education  2019 [place]   1) The drugs that you were given will stay in your system until tomorrow so for the next 24 hours you should not:  A) Drive an automobile B) Make any legal decisions C) Drink any alcoholic beverage   2) You may resume regular meals tomorrow.  Today it is better to start with liquids and gradually work up to solid foods.  You may eat anything you prefer, but it is better to start with liquids, then soup and crackers, and gradually work up to solid foods.   3) Please notify your doctor immediately if you have any unusual bleeding, trouble breathing, redness and pain at the surgery site, drainage, fever, or pain not relieved by medication.    4) Additional Instructions:        Please contact your physician with any problems or Same Day Surgery at [phone], Monday through Friday 6 am to 4 pm, or [place] at [facility] number at [phone].

## 2019-10-02 NOTE — Transfer of Care (Signed)
Immediate Anesthesia Transfer of Care Note  Patient: Emily Fox  Procedure(s) Performed: XI ROBOTIC ASSISTED VENTRAL HERNIA (N/A Abdomen)  Patient Location: PACU  Anesthesia Type:General  Level of Consciousness: sedated  Airway & Oxygen Therapy: Patient Spontanous Breathing and Patient connected to face mask oxygen  Post-op Assessment: Report given to RN and Post -op Vital signs reviewed and stable  Post vital signs: Reviewed and stable  Last Vitals:  Vitals Value Taken Time  BP 139/71 10/02/19 1557  Temp 36.3 C 10/02/19 1557  Pulse 62 10/02/19 1607  Resp 19 10/02/19 1607  SpO2 100 % 10/02/19 1607  Vitals shown include unvalidated device data.  Last Pain:  Vitals:   10/02/19 1557  PainSc: Asleep         Complications: No apparent anesthesia complications

## 2019-10-05 ENCOUNTER — Encounter: Payer: Self-pay | Admitting: Surgery

## 2019-11-30 ENCOUNTER — Other Ambulatory Visit: Payer: Self-pay | Admitting: Internal Medicine

## 2019-11-30 DIAGNOSIS — M5431 Sciatica, right side: Secondary | ICD-10-CM

## 2019-11-30 DIAGNOSIS — J4522 Mild intermittent asthma with status asthmaticus: Secondary | ICD-10-CM

## 2019-12-02 ENCOUNTER — Telehealth: Payer: Self-pay | Admitting: Obstetrics and Gynecology

## 2019-12-02 DIAGNOSIS — Z1231 Encounter for screening mammogram for malignant neoplasm of breast: Secondary | ICD-10-CM

## 2019-12-02 NOTE — Telephone Encounter (Signed)
Pt called in and is requesting a order for a mammogram sent over to [facility]. Pt is requesting a call once order done  Please advise

## 2019-12-06 ENCOUNTER — Ambulatory Visit
Admission: RE | Admit: 2019-12-06 | Discharge: 2019-12-06 | Disposition: A | Discharge status: Home / Self Care | Payer: Medicare Other | Source: Ambulatory Visit | Attending: Internal Medicine | Admitting: Internal Medicine

## 2019-12-06 ENCOUNTER — Other Ambulatory Visit: Payer: Self-pay

## 2019-12-06 DIAGNOSIS — J4522 Mild intermittent asthma with status asthmaticus: Secondary | ICD-10-CM | POA: Diagnosis present

## 2019-12-06 DIAGNOSIS — M5431 Sciatica, right side: Secondary | ICD-10-CM | POA: Diagnosis not present

## 2019-12-07 NOTE — Addendum Note (Signed)
Addended by: Dorian Pod on: 12/07/2019 01:45 PM   Modules accepted: Orders

## 2019-12-07 NOTE — Telephone Encounter (Signed)
LM on patients phone that I will fax order for mammogram tomorrow when Dr. Logan Bores gets back in the office.

## 2019-12-10 NOTE — Telephone Encounter (Signed)
Spoke with [facility] and they said that they did not need an order for the mammogram. I let the patient know. She has already schedule appointment.

## 2019-12-24 ENCOUNTER — Encounter: Payer: Self-pay | Admitting: Obstetrics and Gynecology

## 2019-12-24 ENCOUNTER — Other Ambulatory Visit: Payer: Self-pay

## 2019-12-24 ENCOUNTER — Ambulatory Visit (INDEPENDENT_AMBULATORY_CARE_PROVIDER_SITE_OTHER): Payer: Medicare Other | Admitting: Obstetrics and Gynecology

## 2019-12-24 VITALS — BP 171/89 | HR 67 | Ht 63.0 in | Wt 227.0 lb

## 2019-12-24 DIAGNOSIS — Z90710 Acquired absence of both cervix and uterus: Secondary | ICD-10-CM | POA: Diagnosis not present

## 2019-12-24 DIAGNOSIS — Z01419 Encounter for gynecological examination (general) (routine) without abnormal findings: Secondary | ICD-10-CM | POA: Diagnosis not present

## 2019-12-24 DIAGNOSIS — N9089 Other specified noninflammatory disorders of vulva and perineum: Secondary | ICD-10-CM

## 2019-12-24 DIAGNOSIS — L900 Lichen sclerosus et atrophicus: Secondary | ICD-10-CM

## 2019-12-24 MED ORDER — ESTRADIOL 0.1 MG/GM VA CREA: 0.5000 | TOPICAL_CREAM | VAGINAL | 2 refills | Status: DC

## 2019-12-24 MED ORDER — TEMOVATE 0.05 % EX CREA: 1.0000 "application " | TOPICAL_CREAM | Freq: Every day | CUTANEOUS | 2 refills | Status: DC

## 2019-12-24 NOTE — Addendum Note (Signed)
Addended by: Dorian Pod on: 12/24/2019 02:27 PM   Modules accepted: Orders

## 2019-12-24 NOTE — Progress Notes (Signed)
HPI:      [name] is a 79 y.o. [id number] who LMP was No LMP recorded. Patient has had a hysterectomy.  Subjective:   She presents today for her annual examination.  Patient recently had surgery for an inguinal hernia.  She says it was successful and she feels much better. She states that she was using the cream for her lichen sclerosis but has started to get out of the habit especially since her recent surgery and because her pharmacy is having difficulty obtaining the name brand prescription. She continues to use vaginal Estrace cream. She is up-to-date on mammography.    Hx: The following portions of the patient's history were reviewed and updated as appropriate:             She  has a past medical history of Asthma, Complication of anesthesia, COPD (chronic obstructive pulmonary disease) (HCC), Diverticula of colon, GERD (gastroesophageal reflux disease), History of shingles, Hypertension, Increased BMI, Lichen sclerosus, Lower extremity edema, Menopause, Osteoarthritis, Overactive bladder, Psoriatic arthritis (HCC), Seasonal allergies, and Urinary incontinence. She does not have any pertinent problems on file. She  has a past surgical history that includes knee replacement; Vaginal hysterectomy; Tonsillectomy; Ganglion cyst excision; lower leg fracture; Knee Arthroplasty (Left, 12/05/2016); Joint replacement; Fracture surgery; Colonoscopy with propofol (N/A, 06/23/2018); and XI robotic assisted ventral hernia (N/A, 10/02/2019). Her family history includes Diabetes in her mother; Heart disease in her mother. She  reports that she quit smoking about 51 years ago. She has never used smokeless tobacco. She reports that she does not drink alcohol or use drugs. She has a current medication list which includes the following prescription(s): albuterol, aspirin ec, azelastine, calcium carb-cholecalciferol, carvedilol, cetirizine, cholecalciferol, clindamycin, cranberry plus vitamin c, b-12,  estradiol, fluticasone, fluticasone-salmeterol, folic acid, gabapentin, hydrocodone-acetaminophen, hyoscyamine, ibuprofen, infliximab, magnesium, methotrexate, montelukast, multivitamin with minerals, omeprazole, oxybutynin, potassium gluconate, probiotic product, temovate, torsemide, triamterene-hydrochlorothiazide, trolamine salicylate, and vitamin e. She is allergic to hylan g-f 20; lisinopril; and penicillins.       Review of Systems:  Review of Systems  Constitutional: Denied constitutional symptoms, night sweats, recent illness, fatigue, fever, insomnia and weight loss.  Eyes: Denied eye symptoms, eye pain, photophobia, vision change and visual disturbance.  Ears/Nose/Throat/Neck: Denied ear, nose, throat or neck symptoms, hearing loss, nasal discharge, sinus congestion and sore throat.  Cardiovascular: Denied cardiovascular symptoms, arrhythmia, chest pain/pressure, edema, exercise intolerance, orthopnea and palpitations.  Respiratory: Denied pulmonary symptoms, asthma, pleuritic pain, productive sputum, cough, dyspnea and wheezing.  Gastrointestinal: Denied, gastro-esophageal reflux, melena, nausea and vomiting.  Genitourinary: Denied genitourinary symptoms including symptomatic vaginal discharge, pelvic relaxation issues, and urinary complaints.  Musculoskeletal: Denied musculoskeletal symptoms, stiffness, swelling, muscle weakness and myalgia.  Dermatologic: Denied dermatology symptoms, rash and scar.  Neurologic: Denied neurology symptoms, dizziness, headache, neck pain and syncope.  Psychiatric: Denied psychiatric symptoms, anxiety and depression.  Endocrine: Denied endocrine symptoms including hot flashes and night sweats.   Meds:   Current Outpatient Medications on File Prior to Visit  Medication Sig Dispense Refill  . albuterol (PROAIR HFA) 108 (90 BASE) MCG/ACT inhaler Inhale 2 puffs into the lungs every 4 (four) hours as needed for wheezing or shortness of breath.     Marland Kitchen  aspirin EC 81 MG tablet Take 81 mg by mouth daily.    Marland Kitchen azelastine (OPTIVAR) 0.05 % ophthalmic solution Place 1 drop into both eyes 2 (two) times daily as needed (allergy eyes.).     Marland Kitchen Calcium Carb-Cholecalciferol (CALCIUM + D3 PO) Take  1 tablet by mouth daily.    . carvedilol (COREG) 6.25 MG tablet Take 6.25 mg by mouth 2 (two) times daily.     . cetirizine (ZYRTEC) 10 MG tablet Take 10 mg by mouth at bedtime.     . cholecalciferol (VITAMIN D) 25 MCG (1000 UT) tablet Take 1,000 Units by mouth daily.    . clindamycin (CLEOCIN) 150 MG capsule Take 600 mg by mouth See admin instructions. Take 4 capsules (600 mg) by mouth 1 hour prior to dental work.    . Cranberry-Vitamin C-Vitamin E (CRANBERRY PLUS VITAMIN C) 4200-20-3 MG-MG-UNIT CAPS Take 1 capsule by mouth 2 (two) times daily.    . Cyanocobalamin (B-12) 1000 MCG CAPS Take 1,000 mcg by mouth daily.     Marland Kitchen estradiol (ESTRACE) 0.1 MG/GM vaginal cream Place 0.5 Applicatorfuls vaginally at bedtime. 1/2 gram twice a week (Patient taking differently: Place 0.5 Applicatorfuls vaginally 3 (three) times a week. ) 42.5 g 3  . fluticasone (FLONASE) 50 MCG/ACT nasal spray Place 2 sprays into both nostrils daily as needed for allergies.     . fluticasone-salmeterol (ADVAIR HFA) 115-21 MCG/ACT inhaler Inhale 2 puffs into the lungs 2 (two) times daily as needed (respiratory issues.).     Marland Kitchen folic acid (FOLVITE) 1 MG tablet Take 1 mg by mouth daily.    Marland Kitchen gabapentin (NEURONTIN) 100 MG capsule Take by mouth.    Marland Kitchen HYDROcodone-acetaminophen (NORCO) 5-325 MG tablet Take 1 tablet by mouth every 6 (six) hours as needed for up to 6 doses for moderate pain. 6 tablet 0  . hyoscyamine (LEVSIN SL) 0.125 MG SL tablet Place 0.125 mg under the tongue every 4 (four) hours as needed for cramping.    Marland Kitchen ibuprofen (ADVIL) 800 MG tablet Take 1 tablet (800 mg total) by mouth every 8 (eight) hours as needed for mild pain or moderate pain. 30 tablet 0  . inFLIXimab (REMICADE) 100 MG  injection Inject into the vein every 6 (six) weeks. Every 6 weeks  Next is due 12-18-2016    . Magnesium 500 MG TABS Take 500 mg by mouth daily as needed (cramping).    . methotrexate (RHEUMATREX) 2.5 MG tablet Take 17.5 mg by mouth every Friday. Caution:Chemotherapy. Protect from light.  6 tablets on Fridays    . montelukast (SINGULAIR) 10 MG tablet Take 10 mg by mouth at bedtime.    . Multiple Vitamin (MULTIVITAMIN WITH MINERALS) TABS tablet Take 1 tablet by mouth daily.    Marland Kitchen omeprazole (PRILOSEC) 40 MG capsule Take 40 mg by mouth daily.    Marland Kitchen oxybutynin (DITROPAN-XL) 5 MG 24 hr tablet Take 1 tablet (5 mg total) by mouth at bedtime. 30 tablet 6  . potassium gluconate 595 (99 K) MG TABS tablet Take 595 mg by mouth daily.    . Probiotic Product (PROBIOTIC PO) Take 1 tablet by mouth 2 (two) times daily.     . TEMOVATE 0.05 % Apply 1 application topically daily.    Marland Kitchen torsemide (DEMADEX) 5 MG tablet Take 5 mg by mouth daily.     Marland Kitchen triamterene-hydrochlorothiazide (MAXZIDE-25) 37.5-25 MG tablet Take 1 tablet by mouth daily.    Marland Kitchen trolamine salicylate (ASPERCREME) 10 % cream Apply 1 application topically 3 (three) times daily as needed for muscle pain.     . vitamin E (VITAMIN E) 400 UNIT capsule Take 400 Units by mouth daily.     No current facility-administered medications on file prior to visit.    Objective:  Vitals:   12/24/19 1008  BP: (!) 171/89  Pulse: 67              Physical examination General NAD, Conversant  HEENT Atraumatic; Op clear with mmm.  Normo-cephalic. Pupils reactive. Anicteric sclerae  Thyroid/Neck Smooth without nodularity or enlargement. Normal ROM.  Neck Supple.  Skin No rashes, lesions or ulceration. Normal palpated skin turgor. No nodularity.  Breasts: No masses or discharge.  Symmetric.  No axillary adenopathy.  Lungs: Clear to auscultation.No rales or wheezes. Normal Respiratory effort, no retractions.  Heart: NSR.  No murmurs or rubs appreciated. No  periferal edema  Abdomen: Soft.  Non-tender.  No masses.  No HSM. No hernia  Extremities: Moves all appropriately.  Normal ROM for age. No lymphadenopathy.  Neuro: Oriented to PPT.  Normal mood. Normal affect.     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  Posterior right fourchette shows an area that is new with a more verrucous type appearance.  Vagina: No lesions or abnormalities noted.  Support: Normal pelvic support.  Urethra No masses tenderness or scarring.  Meatus Normal size without lesions or prolapse.  Cervix: Surgically absent   Anus: Normal exam.  No lesions.  Perineum: Normal exam.  No lesions.        Bimanual   Uterus: Surgically absent   Adnexae: No masses.  Non-tender to palpation.  Cul-de-sac: Negative for abnormality.     Assessment:    [id number] Patient Active Problem List   Diagnosis Date Noted  . Stress due to illness of family member 05/08/2018  . S/P total knee arthroplasty 12/05/2016  . Lichen sclerosus 45/40/9811  . Vaginal atrophy 10/27/2015  . Unstable bladder 10/27/2015  . Adult BMI 30+ 10/01/2014  . Arthritis, degenerative 03/08/2014  . Asthma without status asthmaticus 02/28/2014  . B12 deficiency 02/28/2014  . Accumulation of fluid in tissues 02/28/2014  . Acid reflux 02/28/2014  . HLD (hyperlipidemia) 02/28/2014  . BP (high blood pressure) 02/28/2014  . Other allergic rhinitis 02/28/2014  . Psoriatic arthritis ([place]) 02/28/2014     1. Well woman exam with routine gynecological exam   2. Lichen sclerosus   3. Vulvar lesion     New vulvar lesion with verrucous appearance somewhat concerning.   Plan:            1.  Basic Screening Recommendations The basic screening recommendations for asymptomatic women were discussed with the patient during her visit.  The age-appropriate recommendations were discussed with her and the rational for the tests reviewed.  When I am informed by the patient that  another primary care physician has previously obtained the age-appropriate tests and they are up-to-date, only outstanding tests are ordered and referrals given as necessary.  Abnormal results of tests will be discussed with her when all of her results are completed.  Routine preventative health maintenance measures emphasized: Exercise/Diet/Weight control, Tobacco Warnings, Alcohol/Substance use risks and Stress Management Patient will continue Temovate and Estrace cream  2.  I have strongly recommended vulvar biopsy and patient has somewhat reluctantly agreed to schedule this in the next 2 weeks.  Orders No orders of the defined types were placed in this encounter.   No orders of the defined types were placed in this encounter.       F/U  Return in about 2 weeks (around 01/07/2020).  Finis Bud, M.D. 12/24/2019 10:59 AM

## 2019-12-24 NOTE — Progress Notes (Signed)
Patient comes in today for annual exam. Nothing due.

## 2020-01-08 ENCOUNTER — Ambulatory Visit (INDEPENDENT_AMBULATORY_CARE_PROVIDER_SITE_OTHER): Payer: Medicare Other | Admitting: Obstetrics and Gynecology

## 2020-01-08 ENCOUNTER — Other Ambulatory Visit (HOSPITAL_COMMUNITY)
Admission: RE | Admit: 2020-01-08 | Discharge: 2020-01-08 | Disposition: A | Discharge status: Home / Self Care | Payer: Medicare Other | Source: Ambulatory Visit | Attending: Obstetrics and Gynecology | Admitting: Obstetrics and Gynecology

## 2020-01-08 ENCOUNTER — Other Ambulatory Visit: Payer: Self-pay

## 2020-01-08 ENCOUNTER — Encounter: Payer: Self-pay | Admitting: Obstetrics and Gynecology

## 2020-01-08 VITALS — BP 170/84 | HR 80 | Ht 63.0 in | Wt 222.6 lb

## 2020-01-08 DIAGNOSIS — N9089 Other specified noninflammatory disorders of vulva and perineum: Secondary | ICD-10-CM

## 2020-01-08 DIAGNOSIS — L900 Lichen sclerosus et atrophicus: Secondary | ICD-10-CM | POA: Diagnosis present

## 2020-01-08 NOTE — Addendum Note (Signed)
Addended by: Dorian Pod on: 01/08/2020 11:32 AM   Modules accepted: Orders

## 2020-01-08 NOTE — Progress Notes (Signed)
HPI:      [name] is a 79 y.o. [phone] who LMP was No LMP recorded. Patient has had a hysterectomy.  Subjective:   She presents today for vulvar biopsy.  She has been using clobetasol cream intermittently for lichen sclerosus but at her last visit a significant change was noted.  She declined biopsy at that time but has now rescheduled for vulvar biopsy.    Hx: The following portions of the patient's history were reviewed and updated as appropriate:             She  has a past medical history of Asthma, Complication of anesthesia, COPD (chronic obstructive pulmonary disease) (HCC), Diverticula of colon, GERD (gastroesophageal reflux disease), History of shingles, Hypertension, Increased BMI, Lichen sclerosus, Lower extremity edema, Menopause, Osteoarthritis, Overactive bladder, Psoriatic arthritis (HCC), Seasonal allergies, and Urinary incontinence. She does not have any pertinent problems on file. She  has a past surgical history that includes knee replacement; Vaginal hysterectomy; Tonsillectomy; Ganglion cyst excision; lower leg fracture; Knee Arthroplasty (Left, 12/05/2016); Joint replacement; Fracture surgery; Colonoscopy with propofol (N/A, 06/23/2018); and XI robotic assisted ventral hernia (N/A, 10/02/2019). Her family history includes Diabetes in her mother; Heart disease in her mother. She  reports that she quit smoking about 51 years ago. She has never used smokeless tobacco. She reports that she does not drink alcohol or use drugs. She has a current medication list which includes the following prescription(s): albuterol, aspirin ec, azelastine, calcium carb-cholecalciferol, carvedilol, cetirizine, cholecalciferol, clindamycin, cranberry plus vitamin c, b-12, estradiol, fluticasone, fluticasone-salmeterol, folic acid, gabapentin, hydrocodone-acetaminophen, hyoscyamine, ibuprofen, infliximab, magnesium, methotrexate, montelukast, multivitamin with minerals, omeprazole, oxybutynin,  potassium gluconate, probiotic product, temovate, torsemide, triamterene-hydrochlorothiazide, trolamine salicylate, and vitamin e. She is allergic to hylan g-f 20; lisinopril; and penicillins.       Review of Systems:  Review of Systems  Constitutional: Denied constitutional symptoms, night sweats, recent illness, fatigue, fever, insomnia and weight loss.  Eyes: Denied eye symptoms, eye pain, photophobia, vision change and visual disturbance.  Ears/Nose/Throat/Neck: Denied ear, nose, throat or neck symptoms, hearing loss, nasal discharge, sinus congestion and sore throat.  Cardiovascular: Denied cardiovascular symptoms, arrhythmia, chest pain/pressure, edema, exercise intolerance, orthopnea and palpitations.  Respiratory: Denied pulmonary symptoms, asthma, pleuritic pain, productive sputum, cough, dyspnea and wheezing.  Gastrointestinal: Denied, gastro-esophageal reflux, melena, nausea and vomiting.  Genitourinary: Denied genitourinary symptoms including symptomatic vaginal discharge, pelvic relaxation issues, and urinary complaints.  Musculoskeletal: Denied musculoskeletal symptoms, stiffness, swelling, muscle weakness and myalgia.  Dermatologic: Denied dermatology symptoms, rash and scar.  Neurologic: Denied neurology symptoms, dizziness, headache, neck pain and syncope.  Psychiatric: Denied psychiatric symptoms, anxiety and depression.  Endocrine: Denied endocrine symptoms including hot flashes and night sweats.   Meds:   Current Outpatient Medications on File Prior to Visit  Medication Sig Dispense Refill  . albuterol (PROAIR HFA) 108 (90 BASE) MCG/ACT inhaler Inhale 2 puffs into the lungs every 4 (four) hours as needed for wheezing or shortness of breath.     Marland Kitchen aspirin EC 81 MG tablet Take 81 mg by mouth daily.    Marland Kitchen azelastine (OPTIVAR) 0.05 % ophthalmic solution Place 1 drop into both eyes 2 (two) times daily as needed (allergy eyes.).     Marland Kitchen Calcium Carb-Cholecalciferol (CALCIUM + D3  PO) Take 1 tablet by mouth daily.    . carvedilol (COREG) 6.25 MG tablet Take 6.25 mg by mouth 2 (two) times daily.     . cetirizine (ZYRTEC) 10 MG tablet Take 10 mg  by mouth at bedtime.     . cholecalciferol (VITAMIN D) 25 MCG (1000 UT) tablet Take 1,000 Units by mouth daily.    . clindamycin (CLEOCIN) 150 MG capsule Take 600 mg by mouth See admin instructions. Take 4 capsules (600 mg) by mouth 1 hour prior to dental work.    . Cranberry-Vitamin C-Vitamin E (CRANBERRY PLUS VITAMIN C) 4200-20-3 MG-MG-UNIT CAPS Take 1 capsule by mouth 2 (two) times daily.    . Cyanocobalamin (B-12) 1000 MCG CAPS Take 1,000 mcg by mouth daily.     Marland Kitchen estradiol (ESTRACE) 0.1 MG/GM vaginal cream Place 0.5 Applicatorfuls vaginally 3 (three) times a week. 42.5 g 2  . fluticasone (FLONASE) 50 MCG/ACT nasal spray Place 2 sprays into both nostrils daily as needed for allergies.     . fluticasone-salmeterol (ADVAIR HFA) 115-21 MCG/ACT inhaler Inhale 2 puffs into the lungs 2 (two) times daily as needed (respiratory issues.).     Marland Kitchen folic acid (FOLVITE) 1 MG tablet Take 1 mg by mouth daily.    Marland Kitchen gabapentin (NEURONTIN) 100 MG capsule Take by mouth.    Marland Kitchen HYDROcodone-acetaminophen (NORCO) 5-325 MG tablet Take 1 tablet by mouth every 6 (six) hours as needed for up to 6 doses for moderate pain. 6 tablet 0  . hyoscyamine (LEVSIN SL) 0.125 MG SL tablet Place 0.125 mg under the tongue every 4 (four) hours as needed for cramping.    Marland Kitchen ibuprofen (ADVIL) 800 MG tablet Take 1 tablet (800 mg total) by mouth every 8 (eight) hours as needed for mild pain or moderate pain. 30 tablet 0  . inFLIXimab (REMICADE) 100 MG injection Inject into the vein every 6 (six) weeks. Every 6 weeks  Next is due 12-18-2016    . Magnesium 500 MG TABS Take 500 mg by mouth daily as needed (cramping).    . methotrexate (RHEUMATREX) 2.5 MG tablet Take 17.5 mg by mouth every Friday. Caution:Chemotherapy. Protect from light.  6 tablets on Fridays    . montelukast  (SINGULAIR) 10 MG tablet Take 10 mg by mouth at bedtime.    . Multiple Vitamin (MULTIVITAMIN WITH MINERALS) TABS tablet Take 1 tablet by mouth daily.    Marland Kitchen omeprazole (PRILOSEC) 40 MG capsule Take 40 mg by mouth daily.    Marland Kitchen oxybutynin (DITROPAN-XL) 5 MG 24 hr tablet Take 1 tablet (5 mg total) by mouth at bedtime. 30 tablet 6  . potassium gluconate 595 (99 K) MG TABS tablet Take 595 mg by mouth daily.    . Probiotic Product (PROBIOTIC PO) Take 1 tablet by mouth 2 (two) times daily.     . TEMOVATE 0.05 % Apply 1 application topically daily. 30 g 2  . torsemide (DEMADEX) 5 MG tablet Take 5 mg by mouth daily.     Marland Kitchen triamterene-hydrochlorothiazide (MAXZIDE-25) 37.5-25 MG tablet Take 1 tablet by mouth daily.    Marland Kitchen trolamine salicylate (ASPERCREME) 10 % cream Apply 1 application topically 3 (three) times daily as needed for muscle pain.     . vitamin E (VITAMIN E) 400 UNIT capsule Take 400 Units by mouth daily.     No current facility-administered medications on file prior to visit.    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  Posterior right fourchette shows an area that  is new with a more verrucous type appearance.  Vagina: No lesions or abnormalities noted.  Support: Normal pelvic support.  Urethra No masses tenderness or scarring.  Meatus Normal size without lesions or prolapse.  Cervix: Normal appearance.  No lesions.  Anus: Normal exam.  No lesions.  Perineum: Normal exam.  No lesions.   Consent obtained.  Biopsy performed of posterior right fourchette area:  Area cleaned with Betadine, injection with lidocaine, Tischler biopsy of verrucous area, hemostasis obtained with pressure and silver nitrate.   Assessment:    [id number] Patient Active Problem List   Diagnosis Date Noted  . Stress due to illness of family member 05/08/2018  . S/P total  knee arthroplasty 12/05/2016  . Lichen sclerosus 05/39/7673  . Vaginal atrophy 10/27/2015  . Unstable bladder 10/27/2015  . Adult BMI 30+ 10/01/2014  . Arthritis, degenerative 03/08/2014  . Asthma without status asthmaticus 02/28/2014  . B12 deficiency 02/28/2014  . Accumulation of fluid in tissues 02/28/2014  . Acid reflux 02/28/2014  . HLD (hyperlipidemia) 02/28/2014  . BP (high blood pressure) 02/28/2014  . Other allergic rhinitis 02/28/2014  . Psoriatic arthritis ([place]) 02/28/2014     1. Lichen sclerosus   2. Vulvar lesion      Plan:            1.  Biopsy performed.  Will consider management and discussed with patient when results return. Orders No orders of the defined types were placed in this encounter.   No orders of the defined types were placed in this encounter.     F/U  Return for We will contact her with any abnormal test results. I spent 12 minutes involved in the care of this patient preparing to see the patient by obtaining and reviewing her medical history (including labs, imaging tests and prior procedures), documenting clinical information in the electronic health record (EHR), counseling and coordinating care plans, writing and sending prescriptions, ordering tests or procedures and directly communicating with the patient by discussing pertinent items from her history and physical exam as well as detailing my assessment and plan as noted above so that she has an informed understanding.  All of her questions were answered.  Finis Bud, M.D. 01/08/2020 11:15 AM

## 2020-01-11 LAB — SURGICAL PATHOLOGY

## 2020-01-29 ENCOUNTER — Telehealth: Payer: Self-pay | Admitting: Obstetrics and Gynecology

## 2020-01-29 MED ORDER — OXYBUTYNIN CHLORIDE ER 5 MG PO TB24: 5.0000 mg | ORAL_TABLET | Freq: Every day | ORAL | 6 refills | Status: DC

## 2020-01-29 NOTE — Telephone Encounter (Signed)
Pt called in and needs a refill on Oxybutynin sent to [facility] [place]. Please advise

## 2020-01-29 NOTE — Telephone Encounter (Signed)
Pt pt also stated that she needs a call for the nurse about her biopsy. Please advise

## 2020-01-29 NOTE — Telephone Encounter (Signed)
Prescription sent to pharmacy.

## 2020-02-12 ENCOUNTER — Telehealth: Payer: Self-pay | Admitting: Obstetrics and Gynecology

## 2020-02-12 NOTE — Telephone Encounter (Signed)
Patient called in saying that she had a vulvar biopsy done last month around the 19th. She was calling to see what the results of that test was and if she needed to make another appointment.

## 2020-02-12 NOTE — Telephone Encounter (Signed)
Please advise on results

## 2020-02-18 NOTE — Telephone Encounter (Signed)
Patient came into office and was notified of results.

## 2020-03-04 ENCOUNTER — Telehealth: Payer: Self-pay | Admitting: Obstetrics and Gynecology

## 2020-03-04 NOTE — Telephone Encounter (Signed)
error 

## 2020-08-18 ENCOUNTER — Telehealth: Payer: Self-pay

## 2020-08-18 ENCOUNTER — Encounter: Payer: Medicare Other | Admitting: Obstetrics and Gynecology

## 2020-08-18 NOTE — Telephone Encounter (Signed)
LVM for patient to call the office to reschedule her missed appt.

## 2020-08-25 ENCOUNTER — Encounter: Payer: Self-pay | Admitting: Obstetrics and Gynecology

## 2020-08-25 ENCOUNTER — Ambulatory Visit (INDEPENDENT_AMBULATORY_CARE_PROVIDER_SITE_OTHER): Payer: Medicare Other | Admitting: Obstetrics and Gynecology

## 2020-08-25 ENCOUNTER — Other Ambulatory Visit: Payer: Self-pay

## 2020-08-25 VITALS — BP 156/69 | HR 69 | Ht 63.0 in | Wt 217.0 lb

## 2020-08-25 DIAGNOSIS — L900 Lichen sclerosus et atrophicus: Secondary | ICD-10-CM

## 2020-08-25 MED ORDER — CLOBETASOL PROPIONATE 0.05 % EX OINT: TOPICAL_OINTMENT | CUTANEOUS | Status: AC

## 2020-08-25 NOTE — Progress Notes (Signed)
HPI:      Emily Fox is a 79 y.o. [phone] who LMP was No LMP recorded. Patient has had a hysterectomy.  Subjective:   She presents today for follow-up of her lichen sclerosus.  She had significant leukoplakia of the vulva noted and a biopsy was performed and revealed lichen sclerosus.  She reports that she has run out of her clobetasol and would need a new prescription.  She was using the clobetasol 3 times per week.    Hx: The following portions of the patient's history were reviewed and updated as appropriate:             She  has a past medical history of Asthma, Complication of anesthesia, COPD (chronic obstructive pulmonary disease) (HCC), Diverticula of colon, GERD (gastroesophageal reflux disease), History of shingles, Hypertension, Increased BMI, Lichen sclerosus, Lower extremity edema, Menopause, Osteoarthritis, Overactive bladder, Psoriatic arthritis (HCC), Seasonal allergies, and Urinary incontinence. She does not have any pertinent problems on file. She  has a past surgical history that includes knee replacement; Vaginal hysterectomy; Tonsillectomy; Ganglion cyst excision; lower leg fracture; Knee Arthroplasty (Left, 12/05/2016); Joint replacement; Fracture surgery; Colonoscopy with propofol (N/A, 06/23/2018); and XI robotic assisted ventral hernia (N/A, 10/02/2019). Her family history includes Diabetes in her mother; Heart disease in her mother. She  reports that she quit smoking about 51 years ago. She has never used smokeless tobacco. She reports that she does not drink alcohol and does not use drugs. She has a current medication list which includes the following prescription(s): albuterol, aspirin ec, azelastine, calcium carb-cholecalciferol, carvedilol, cetirizine, cholecalciferol, clindamycin, cranberry plus vitamin c, b-12, estradiol, fluticasone, fluticasone-salmeterol, folic acid, gabapentin, hydrocodone-acetaminophen, hyoscyamine, ibuprofen, infliximab, magnesium,  methotrexate, montelukast, multivitamin with minerals, omeprazole, oxybutynin, potassium gluconate, probiotic product, temovate, torsemide, triamterene-hydrochlorothiazide, trolamine salicylate, and vitamin e, and the following Facility-Administered Medications: clobetasol ointment. She is allergic to hylan g-f 20, lisinopril, and penicillins.       Review of Systems:  Review of Systems  Constitutional: Denied constitutional symptoms, night sweats, recent illness, fatigue, fever, insomnia and weight loss.  Eyes: Denied eye symptoms, eye pain, photophobia, vision change and visual disturbance.  Ears/Nose/Throat/Neck: Denied ear, nose, throat or neck symptoms, hearing loss, nasal discharge, sinus congestion and sore throat.  Cardiovascular: Denied cardiovascular symptoms, arrhythmia, chest pain/pressure, edema, exercise intolerance, orthopnea and palpitations.  Respiratory: Denied pulmonary symptoms, asthma, pleuritic pain, productive sputum, cough, dyspnea and wheezing.  Gastrointestinal: Denied, gastro-esophageal reflux, melena, nausea and vomiting.  Genitourinary: Denied genitourinary symptoms including symptomatic vaginal discharge, pelvic relaxation issues, and urinary complaints.  Musculoskeletal: Denied musculoskeletal symptoms, stiffness, swelling, muscle weakness and myalgia.  Dermatologic: Denied dermatology symptoms, rash and scar.  Neurologic: Denied neurology symptoms, dizziness, headache, neck pain and syncope.  Psychiatric: Denied psychiatric symptoms, anxiety and depression.  Endocrine: Denied endocrine symptoms including hot flashes and night sweats.   Meds:   Current Outpatient Medications on File Prior to Visit  Medication Sig Dispense Refill   albuterol (PROAIR HFA) 108 (90 BASE) MCG/ACT inhaler Inhale 2 puffs into the lungs every 4 (four) hours as needed for wheezing or shortness of breath.      aspirin EC 81 MG tablet Take 81 mg by mouth daily.     azelastine (OPTIVAR)  0.05 % ophthalmic solution Place 1 drop into both eyes 2 (two) times daily as needed (allergy eyes.).      Calcium Carb-Cholecalciferol (CALCIUM + D3 PO) Take 1 tablet by mouth daily.     carvedilol (COREG) 6.25 MG tablet  Take 6.25 mg by mouth 2 (two) times daily.      cetirizine (ZYRTEC) 10 MG tablet Take 10 mg by mouth at bedtime.      cholecalciferol (VITAMIN D) 25 MCG (1000 UT) tablet Take 1,000 Units by mouth daily.     clindamycin (CLEOCIN) 150 MG capsule Take 600 mg by mouth See admin instructions. Take 4 capsules (600 mg) by mouth 1 hour prior to dental work.     Cranberry-Vitamin C-Vitamin E (CRANBERRY PLUS VITAMIN C) 4200-20-3 MG-MG-UNIT CAPS Take 1 capsule by mouth 2 (two) times daily.     Cyanocobalamin (B-12) 1000 MCG CAPS Take 1,000 mcg by mouth daily.      estradiol (ESTRACE) 0.1 MG/GM vaginal cream Place 0.5 Applicatorfuls vaginally 3 (three) times a week. 42.5 g 2   fluticasone (FLONASE) 50 MCG/ACT nasal spray Place 2 sprays into both nostrils daily as needed for allergies.      fluticasone-salmeterol (ADVAIR HFA) 115-21 MCG/ACT inhaler Inhale 2 puffs into the lungs 2 (two) times daily as needed (respiratory issues.).      folic acid (FOLVITE) 1 MG tablet Take 1 mg by mouth daily.     gabapentin (NEURONTIN) 100 MG capsule Take by mouth.     HYDROcodone-acetaminophen (NORCO) 5-325 MG tablet Take 1 tablet by mouth every 6 (six) hours as needed for up to 6 doses for moderate pain. 6 tablet 0   hyoscyamine (LEVSIN SL) 0.125 MG SL tablet Place 0.125 mg under the tongue every 4 (four) hours as needed for cramping.     ibuprofen (ADVIL) 800 MG tablet Take 1 tablet (800 mg total) by mouth every 8 (eight) hours as needed for mild pain or moderate pain. 30 tablet 0   inFLIXimab (REMICADE) 100 MG injection Inject into the vein every 6 (six) weeks. Every 6 weeks  Next is due 12-18-2016     Magnesium 500 MG TABS Take 500 mg by mouth daily as needed (cramping).      methotrexate (RHEUMATREX) 2.5 MG tablet Take 17.5 mg by mouth every Friday. Caution:Chemotherapy. Protect from light.  6 tablets on Fridays     montelukast (SINGULAIR) 10 MG tablet Take 10 mg by mouth at bedtime.     Multiple Vitamin (MULTIVITAMIN WITH MINERALS) TABS tablet Take 1 tablet by mouth daily.     omeprazole (PRILOSEC) 40 MG capsule Take 40 mg by mouth daily.     oxybutynin (DITROPAN-XL) 5 MG 24 hr tablet Take 1 tablet (5 mg total) by mouth at bedtime. 30 tablet 6   potassium gluconate 595 (99 K) MG TABS tablet Take 595 mg by mouth daily.     Probiotic Product (PROBIOTIC PO) Take 1 tablet by mouth 2 (two) times daily.      TEMOVATE 0.05 % Apply 1 application topically daily. 30 g 2   torsemide (DEMADEX) 5 MG tablet Take 5 mg by mouth daily.      triamterene-hydrochlorothiazide (MAXZIDE-25) 37.5-25 MG tablet Take 1 tablet by mouth daily.     trolamine salicylate (ASPERCREME) 10 % cream Apply 1 application topically 3 (three) times daily as needed for muscle pain.      vitamin E (VITAMIN E) 400 UNIT capsule Take 400 Units by mouth daily.     No current facility-administered medications on file prior to visit.          Objective:     Vitals:   08/25/20 1140  BP: (!) 156/69  Pulse: 69   Filed Weights   08/25/20 1140  Weight: 217 lb (98.4 kg)              Physical examination   Pelvic:   Vulva:  Posterior leukoplakia much improved.  Anteriorly labial fusion noted-tender to palpation.  Vagina: No lesions or abnormalities noted.  Support: Normal pelvic support.  Urethra No masses tenderness or scarring.  Meatus Normal size without lesions or prolapse.     Anus: Normal exam.  No lesions.  Perineum: Normal exam.  No lesions.                 Assessment:    [id number] Patient Active Problem List   Diagnosis Date Noted   Stress due to illness of family member 05/08/2018   S/P total knee arthroplasty 12/05/2016   Lichen sclerosus 10/27/2015    Vaginal atrophy 10/27/2015   Unstable bladder 10/27/2015   Adult BMI 30+ 10/01/2014   Arthritis, degenerative 03/08/2014   Asthma without status asthmaticus 02/28/2014   B12 deficiency 02/28/2014   Accumulation of fluid in tissues 02/28/2014   Acid reflux 02/28/2014   HLD (hyperlipidemia) 02/28/2014   BP (high blood pressure) 02/28/2014   Other allergic rhinitis 02/28/2014   Psoriatic arthritis (HCC) 02/28/2014     1. Lichen sclerosus     Patient somewhat symptomatic with lichen sclerosus especially with anterior labial fusion.   Plan:            1.  Restart use of twice weekly clobetasol.  We will try ointment versus cream.  Special attention to anterior labial fusion. Orders No orders of the defined types were placed in this encounter.    Meds ordered this encounter  Medications   clobetasol ointment (TEMOVATE) 0.05 %      F/U  No follow-ups on file. I spent 22 minutes involved in the care of this patient preparing to see the patient by obtaining and reviewing her medical history (including labs, imaging tests and prior procedures), documenting clinical information in the electronic health record (EHR), counseling and coordinating care plans, writing and sending prescriptions, ordering tests or procedures and directly communicating with the patient by discussing pertinent items from her history and physical exam as well as detailing my assessment and plan as noted above so that she has an informed understanding.  All of her questions were answered.  Elonda Husky, M.D. 08/25/2020 12:11 PM

## 2020-09-14 ENCOUNTER — Telehealth: Payer: Self-pay

## 2020-09-14 MED ORDER — OXYBUTYNIN CHLORIDE ER 5 MG PO TB24: 5.0000 mg | ORAL_TABLET | Freq: Every day | ORAL | 6 refills | Status: DC

## 2020-09-14 MED ORDER — TEMOVATE 0.05 % EX CREA: 1.0000 "application " | TOPICAL_CREAM | Freq: Every day | CUTANEOUS | 2 refills | Status: DC

## 2020-09-14 NOTE — Telephone Encounter (Signed)
Prescription sent to pharmacy. LM for patient that this has been sent in.

## 2020-09-14 NOTE — Telephone Encounter (Signed)
oxybutynin (DITROPAN-XL) 5 MG 24 hr tablet  clobetasol ointment (TEMOVATE) 0.05 %    Pt states she needs these meds sent to [facility] in [place].   Pt states she has been waiting since her last visit for these meds. She is headed out of town this afternoon.   Pls advise.

## 2020-09-14 NOTE — Telephone Encounter (Signed)
Left another message that prescriptions have been sent to the pharmacy.

## 2021-01-24 ENCOUNTER — Ambulatory Visit (INDEPENDENT_AMBULATORY_CARE_PROVIDER_SITE_OTHER): Payer: Medicare Other | Admitting: Obstetrics and Gynecology

## 2021-01-24 ENCOUNTER — Encounter: Payer: Self-pay | Admitting: Obstetrics and Gynecology

## 2021-01-24 ENCOUNTER — Other Ambulatory Visit: Payer: Self-pay

## 2021-01-24 VITALS — BP 105/72 | HR 67 | Ht 63.0 in | Wt 218.1 lb

## 2021-01-24 DIAGNOSIS — N3001 Acute cystitis with hematuria: Secondary | ICD-10-CM

## 2021-01-24 DIAGNOSIS — R102 Pelvic and perineal pain: Secondary | ICD-10-CM

## 2021-01-24 DIAGNOSIS — L900 Lichen sclerosus et atrophicus: Secondary | ICD-10-CM | POA: Diagnosis not present

## 2021-01-24 LAB — POCT URINALYSIS DIPSTICK
Bilirubin, UA: NEGATIVE
Glucose, UA: NEGATIVE
Ketones, UA: NEGATIVE
Nitrite, UA: NEGATIVE
Protein, UA: POSITIVE — AB
Spec Grav, UA: 1.01 (ref 1.010–1.025)
Urobilinogen, UA: 0.2 E.U./dL
pH, UA: 6 (ref 5.0–8.0)

## 2021-01-24 MED ORDER — TEMOVATE 0.05 % EX CREA: 1.0000 "application " | TOPICAL_CREAM | Freq: Every day | CUTANEOUS | 2 refills | Status: DC

## 2021-01-24 MED ORDER — ESTRADIOL 0.1 MG/GM VA CREA: 0.5000 | TOPICAL_CREAM | VAGINAL | 2 refills | Status: DC

## 2021-01-24 MED ORDER — NITROFURANTOIN MONOHYD MACRO 100 MG PO CAPS: 100.0000 mg | ORAL_CAPSULE | Freq: Two times a day (BID) | ORAL | 0 refills | Status: DC

## 2021-01-24 NOTE — Progress Notes (Signed)
HPI:      [name] is a 80 y.o. [phone] who LMP was No LMP recorded. Patient has had a hysterectomy.  Subjective:   She presents today for her annual examination.  She is really here for 2 distinct reasons.  #1 she has lichen sclerosus and uses Temovate.  She says she can "tolerate it".  She is using Temovate twice per week.  She says sometimes she increases this more frequently when she has symptoms. She also states that since last Thursday she has had pressure and frequency with some burning at urination.    Hx: The following portions of the patient's history were reviewed and updated as appropriate:             She  has a past medical history of Asthma, Complication of anesthesia, COPD (chronic obstructive pulmonary disease) (HCC), Diverticula of colon, GERD (gastroesophageal reflux disease), History of shingles, Hypertension, Increased BMI, Lichen sclerosus, Lower extremity edema, Menopause, Osteoarthritis, Overactive bladder, Psoriatic arthritis (HCC), Seasonal allergies, and Urinary incontinence. She does not have any pertinent problems on file. She  has a past surgical history that includes knee replacement; Vaginal hysterectomy; Tonsillectomy; Ganglion cyst excision; lower leg fracture; Knee Arthroplasty (Left, 12/05/2016); Joint replacement; Fracture surgery; Colonoscopy with propofol (N/A, 06/23/2018); and XI robotic assisted ventral hernia (N/A, 10/02/2019). Her family history includes Diabetes in her mother; Heart disease in her mother. She  reports that she quit smoking about 52 years ago. She has never used smokeless tobacco. She reports that she does not drink alcohol and does not use drugs. She has a current medication list which includes the following prescription(s): albuterol, aspirin ec, azelastine, calcium carb-cholecalciferol, cetirizine, cholecalciferol, clindamycin, cranberry plus vitamin c, b-12, fluticasone, fluticasone-salmeterol, folic acid, infliximab, magnesium,  methotrexate, montelukast, multivitamin with minerals, nitrofurantoin (macrocrystal-monohydrate), omeprazole, oxybutynin, potassium gluconate, probiotic product, triamterene-hydrochlorothiazide, trolamine salicylate, vitamin e, carvedilol, [START ON 01/25/2021] estradiol, hydrocodone-acetaminophen, hyoscyamine, ibuprofen, temovate, and torsemide, and the following Facility-Administered Medications: clobetasol ointment. She is allergic to hylan g-f 20, lisinopril, and penicillins.       Review of Systems:  Review of Systems  Constitutional: Denied constitutional symptoms, night sweats, recent illness, fatigue, fever, insomnia and weight loss.  Eyes: Denied eye symptoms, eye pain, photophobia, vision change and visual disturbance.  Ears/Nose/Throat/Neck: Denied ear, nose, throat or neck symptoms, hearing loss, nasal discharge, sinus congestion and sore throat.  Cardiovascular: Denied cardiovascular symptoms, arrhythmia, chest pain/pressure, edema, exercise intolerance, orthopnea and palpitations.  Respiratory: Denied pulmonary symptoms, asthma, pleuritic pain, productive sputum, cough, dyspnea and wheezing.  Gastrointestinal: Denied, gastro-esophageal reflux, melena, nausea and vomiting.  Genitourinary: See HPI for additional information.  Musculoskeletal: Denied musculoskeletal symptoms, stiffness, swelling, muscle weakness and myalgia.  Dermatologic: Denied dermatology symptoms, rash and scar.  Neurologic: Denied neurology symptoms, dizziness, headache, neck pain and syncope.  Psychiatric: Denied psychiatric symptoms, anxiety and depression.  Endocrine: Denied endocrine symptoms including hot flashes and night sweats.   Meds:   Current Outpatient Medications on File Prior to Visit  Medication Sig Dispense Refill  . albuterol (VENTOLIN HFA) 108 (90 Base) MCG/ACT inhaler Inhale 2 puffs into the lungs every 4 (four) hours as needed for wheezing or shortness of breath.     Marland Kitchen. aspirin EC 81 MG tablet  Take 81 mg by mouth daily.    Marland Kitchen. azelastine (OPTIVAR) 0.05 % ophthalmic solution Place 1 drop into both eyes 2 (two) times daily as needed (allergy eyes.).     Marland Kitchen. Calcium Carb-Cholecalciferol (CALCIUM + D3 PO) Take 1  tablet by mouth daily.    . cetirizine (ZYRTEC) 10 MG tablet Take 10 mg by mouth at bedtime.    . cholecalciferol (VITAMIN D) 25 MCG (1000 UT) tablet Take 1,000 Units by mouth daily.    . clindamycin (CLEOCIN) 150 MG capsule Take 600 mg by mouth See admin instructions. Take 4 capsules (600 mg) by mouth 1 hour prior to dental work.    . Cranberry-Vitamin C-Vitamin E (CRANBERRY PLUS VITAMIN C) 4200-20-3 MG-MG-UNIT CAPS Take 1 capsule by mouth 2 (two) times daily.    . Cyanocobalamin (B-12) 1000 MCG CAPS Take 1,000 mcg by mouth daily.     . fluticasone (FLONASE) 50 MCG/ACT nasal spray Place 2 sprays into both nostrils daily as needed for allergies.     . fluticasone-salmeterol (ADVAIR HFA) 115-21 MCG/ACT inhaler Inhale 2 puffs into the lungs 2 (two) times daily as needed (respiratory issues.).     Marland Kitchen folic acid (FOLVITE) 1 MG tablet Take 1 mg by mouth daily.    Marland Kitchen inFLIXimab (REMICADE) 100 MG injection Inject into the vein every 6 (six) weeks. Every 6 weeks  Next is due 12-18-2016    . Magnesium 500 MG TABS Take 500 mg by mouth daily as needed (cramping).    . methotrexate (RHEUMATREX) 2.5 MG tablet Take 17.5 mg by mouth every Friday. Caution:Chemotherapy. Protect from light.  6 tablets on Fridays    . montelukast (SINGULAIR) 10 MG tablet Take 10 mg by mouth at bedtime.    . Multiple Vitamin (MULTIVITAMIN WITH MINERALS) TABS tablet Take 1 tablet by mouth daily.    Marland Kitchen omeprazole (PRILOSEC) 40 MG capsule Take 40 mg by mouth daily.    Marland Kitchen oxybutynin (DITROPAN-XL) 5 MG 24 hr tablet Take 1 tablet (5 mg total) by mouth at bedtime. 30 tablet 6  . potassium gluconate 595 (99 K) MG TABS tablet Take 595 mg by mouth daily.    . Probiotic Product (PROBIOTIC PO) Take 1 tablet by mouth 2 (two) times daily.      Marland Kitchen triamterene-hydrochlorothiazide (MAXZIDE-25) 37.5-25 MG tablet Take 1 tablet by mouth daily.    Marland Kitchen trolamine salicylate (ASPERCREME) 10 % cream Apply 1 application topically 3 (three) times daily as needed for muscle pain.     . vitamin E 180 MG (400 UNITS) capsule Take 400 Units by mouth daily.    . carvedilol (COREG) 6.25 MG tablet Take 6.25 mg by mouth 2 (two) times daily.     Marland Kitchen HYDROcodone-acetaminophen (NORCO) 5-325 MG tablet Take 1 tablet by mouth every 6 (six) hours as needed for up to 6 doses for moderate pain. (Patient not taking: Reported on 01/24/2021) 6 tablet 0  . hyoscyamine (LEVSIN SL) 0.125 MG SL tablet Place 0.125 mg under the tongue every 4 (four) hours as needed for cramping. (Patient not taking: Reported on 01/24/2021)    . ibuprofen (ADVIL) 800 MG tablet Take 1 tablet (800 mg total) by mouth every 8 (eight) hours as needed for mild pain or moderate pain. (Patient not taking: Reported on 01/24/2021) 30 tablet 0  . torsemide (DEMADEX) 5 MG tablet Take 5 mg by mouth daily.      Current Facility-Administered Medications on File Prior to Visit  Medication Dose Route Frequency Provider Last Rate Last Admin  . clobetasol ointment (TEMOVATE) 0.05 %   Topical Once per day on Mon Thu Jackelynn Hosie James, MD              Objective:     Vitals:  01/24/21 1021  BP: 105/72  Pulse: 67    Filed Weights   01/24/21 1021  Weight: 218 lb 1.6 oz (98.9 kg)              Physical examination General NAD, Conversant  HEENT Atraumatic; Op clear with mmm.  Normo-cephalic. Pupils reactive. Anicteric sclerae  Thyroid/Neck Smooth without nodularity or enlargement. Normal ROM.  Neck Supple.  Skin No rashes, lesions or ulceration. Normal palpated skin turgor. No nodularity.  Breasts: No masses or discharge.  Symmetric.  No axillary adenopathy.  Lungs: Clear to auscultation.No rales or wheezes. Normal Respiratory effort, no retractions.  Heart: NSR.  No murmurs or rubs appreciated. No  periferal edema  Abdomen: Soft.  Non-tender.  No masses.  No HSM. No hernia  Extremities: Moves all appropriately.  Normal ROM for age. No lymphadenopathy.  Neuro: Oriented to PPT.  Normal mood. Normal affect.     Pelvic:   Vulva:  Lichen sclerosus noted-fusion of anterior labia.  Vagina: No lesions or abnormalities noted.  Support: Normal pelvic support.  Urethra No masses tenderness or scarring.  Meatus Normal size without lesions or prolapse.  Cervix: Surgically absent   Anus: Normal exam.  No lesions.  Perineum: Normal exam.  No lesions.        Bimanual   Uterus: Surgically absent   Adnexae: No masses.  Non-tender to palpation.  Cul-de-sac: Negative for abnormality.     Assessment:    [id number] Patient Active Problem List   Diagnosis Date Noted  . Stress due to illness of family member 05/08/2018  . S/P total knee arthroplasty 12/05/2016  . Lichen sclerosus 10/27/2015  . Vaginal atrophy 10/27/2015  . Unstable bladder 10/27/2015  . Adult BMI 30+ 10/01/2014  . Arthritis, degenerative 03/08/2014  . Asthma without status asthmaticus 02/28/2014  . B12 deficiency 02/28/2014  . Accumulation of fluid in tissues 02/28/2014  . Acid reflux 02/28/2014  . HLD (hyperlipidemia) 02/28/2014  . BP (high blood pressure) 02/28/2014  . Other allergic rhinitis 02/28/2014  . Psoriatic arthritis (HCC) 02/28/2014     1. Lichen sclerosus   2. Pelvic pressure in female   3. Acute cystitis with hematuria     Urine consistent with UTI.  Lichen sclerosus stable-symptoms controlled with clobetasol.   Plan:            1.  Basic Screening Recommendations The basic screening recommendations for asymptomatic women were discussed with the patient during her visit.  The age-appropriate recommendations were discussed with her and the rational for the tests reviewed.  When I am informed by the patient that another primary care physician has previously obtained the age-appropriate tests and they are  up-to-date, only outstanding tests are ordered and referrals given as necessary.  Abnormal results of tests will be discussed with her when all of her results are completed.  Routine preventative health maintenance measures emphasized: Exercise/Diet/Weight control, Tobacco Warnings, Alcohol/Substance use risks and Stress Management Pap smear and mammography screenings discussed. 2.  Continue clobetasol use have advised every other day and especially anterior labial area where she is having more pain from the fusion. 3.  Macrobid for UTI.  Urine sent for C&S. Orders Orders Placed This Encounter  Procedures  . POCT urinalysis dipstick     Meds ordered this encounter  Medications  . estradiol (ESTRACE) 0.1 MG/GM vaginal cream    Sig: Place 0.5 Applicatorfuls vaginally 3 (three) times a week.    Dispense:  42.5 g    Refill:  2  . TEMOVATE 0.05 %    Sig: Apply 1 application topically daily.    Dispense:  30 g    Refill:  2  . nitrofurantoin, macrocrystal-monohydrate, (MACROBID) 100 MG capsule    Sig: Take 1 capsule (100 mg total) by mouth 2 (two) times daily.    Dispense:  14 capsule    Refill:  0            F/U  No follow-ups on file. I spent 23 minutes involved in the care of this patient preparing to see the patient by obtaining and reviewing her medical history (including labs, imaging tests and prior procedures), documenting clinical information in the electronic health record (EHR), counseling and coordinating care plans, writing and sending prescriptions, ordering tests or procedures and directly communicating with the patient by discussing pertinent items from her history and physical exam as well as detailing my assessment and plan as noted above so that she has an informed understanding.  All of her questions were answered.  Elonda Husky, M.D. 01/24/2021 10:39 AM

## 2021-01-25 ENCOUNTER — Other Ambulatory Visit: Payer: Self-pay | Admitting: Surgical

## 2021-01-25 MED ORDER — CLOBETASOL PROPIONATE 0.05 % EX CREA: 1.0000 "application " | TOPICAL_CREAM | Freq: Two times a day (BID) | CUTANEOUS | 2 refills | Status: DC

## 2021-02-21 ENCOUNTER — Other Ambulatory Visit (HOSPITAL_COMMUNITY): Payer: Self-pay | Admitting: Rheumatology

## 2021-02-21 ENCOUNTER — Other Ambulatory Visit: Payer: Self-pay | Admitting: Rheumatology

## 2021-02-21 DIAGNOSIS — M25512 Pain in left shoulder: Secondary | ICD-10-CM

## 2021-03-06 ENCOUNTER — Ambulatory Visit
Admission: RE | Admit: 2021-03-06 | Discharge: 2021-03-06 | Disposition: A | Discharge status: Home / Self Care | Payer: Medicare Other | Source: Ambulatory Visit | Attending: Rheumatology | Admitting: Rheumatology

## 2021-03-06 ENCOUNTER — Other Ambulatory Visit: Payer: Self-pay

## 2021-03-06 DIAGNOSIS — M25512 Pain in left shoulder: Secondary | ICD-10-CM | POA: Diagnosis present

## 2021-03-24 ENCOUNTER — Other Ambulatory Visit: Payer: Self-pay | Admitting: Surgery

## 2021-03-31 ENCOUNTER — Encounter
Admission: RE | Admit: 2021-03-31 | Discharge: 2021-03-31 | Disposition: A | Discharge status: Home / Self Care | Payer: Medicare Other | Source: Ambulatory Visit | Attending: Surgery | Admitting: Surgery

## 2021-03-31 ENCOUNTER — Other Ambulatory Visit: Payer: Self-pay

## 2021-03-31 HISTORY — DX: Personal history of urinary calculi: Z87.442

## 2021-03-31 NOTE — Patient Instructions (Addendum)
Your procedure is scheduled on: 04/11/21- Tuesday Report to the Registration Desk on the 1st floor of [facility]. To find out your arrival time, please call [phone] between 1PM - 3PM on: 04/10/21 - Monday Report to [facility] for labs and bag on 04/07/21 at 10 am.  REMEMBER: Instructions that are not followed completely may result in serious medical risk, up to and including death; or upon the discretion of your surgeon and anesthesiologist your surgery may need to be rescheduled.  Do not eat food after midnight the night before surgery.  No gum chewing, lozengers or hard candies.  You may however, drink CLEAR liquids up to 2 hours before you are scheduled to arrive for your surgery. Do not drink anything within 2 hours of your scheduled arrival time.  Clear liquids include: - water  - apple juice without pulp - gatorade (not RED, PURPLE, OR BLUE) - black coffee or tea (Do NOT add milk or creamers to the coffee or tea) Do NOT drink anything that is not on this list.  In addition, your doctor has ordered for you to drink the provided  Ensure Pre-Surgery Clear Carbohydrate Drink  Drinking this carbohydrate drink up to two hours before surgery helps to reduce insulin resistance and improve patient outcomes. Please complete drinking 2 hours prior to scheduled arrival time.  TAKE THESE MEDICATIONS THE MORNING OF SURGERY WITH A SIP OF WATER: - carvedilol (COREG) 6.25 MG tablet - fluticasone-salmeterol (ADVAIR HFA) 115-21 MCG/ACT inhaler - omeprazole (PRILOSEC) 40 MG capsule, take one the night before and one on the morning of surgery - helps to prevent nausea after surgery.  Use albuterol (VENTOLIN HFA) 108 (90 Base) MCG/ACT inhaler on the day of surgery and bring to the hospital.  Follow recommendations from Cardiologist, Pulmonologist or PCP regarding stopping Aspirin, Coumadin, Plavix, Eliquis, Pradaxa, or Pletal. Continue taking Aspirin daily but do not take the morning of  surgery.  One week prior to surgery: Stop taking beginning 04/03/21 Stop Anti-inflammatories (NSAIDS) such as Advil, Aleve, Ibuprofen, Motrin, Naproxen, Naprosyn and Aspirin based products such as Excedrin, Goodys Powder, BC Powder.  Stop on 04/03/21 , ANY OVER THE COUNTER supplements until after surgery.  No Alcohol for 24 hours before or after surgery.  No Smoking including e-cigarettes for 24 hours prior to surgery.  No chewable tobacco products for at least 6 hours prior to surgery.  No nicotine patches on the day of surgery.  Do not use any "recreational" drugs for at least a week prior to your surgery.  Please be advised that the combination of cocaine and anesthesia may have negative outcomes, up to and including death. If you test positive for cocaine, your surgery will be cancelled.  On the morning of surgery brush your teeth with toothpaste and water, you may rinse your mouth with mouthwash if you wish. Do not swallow any toothpaste or mouthwash.  Do not wear jewelry, make-up, hairpins, clips or nail polish.  Do not wear lotions, powders, or perfumes.   Do not shave body from the neck down 48 hours prior to surgery just in case you cut yourself which could leave a site for infection.  Also, freshly shaved skin may become irritated if using the CHG soap.  Contact lenses, hearing aids and dentures may not be worn into surgery.  Do not bring valuables to the hospital. [facility] is not responsible for any missing/lost belongings or valuables.   Use CHG Soap or wipes as directed on instruction  sheet.  Notify your doctor if there is any change in your medical condition (cold, fever, infection).  Wear comfortable clothing (specific to your surgery type) to the hospital.  Plan for stool softeners for home use; pain medications have a tendency to cause constipation. You can also help prevent constipation by eating foods high in fiber such as fruits and vegetables and drinking  plenty of fluids as your diet allows.  After surgery, you can help prevent lung complications by doing breathing exercises.  Take deep breaths and cough every 1-2 hours. Your doctor may order a device called an Incentive Spirometer to help you take deep breaths. When coughing or sneezing, hold a pillow firmly against your incision with both hands. This is called "splinting." Doing this helps protect your incision. It also decreases belly discomfort.  If you are being admitted to the hospital overnight, leave your suitcase in the car. After surgery it may be brought to your room.  If you are being discharged the day of surgery, you will not be allowed to drive home. You will need a responsible adult (18 years or older) to drive you home and stay with you that night.   If you are taking public transportation, you will need to have a responsible adult (18 years or older) with you. Please confirm with your physician that it is acceptable to use public transportation.   Please call the Pre-admissions Testing Dept. at [phone] if you have any questions about these instructions.  Surgery Visitation Policy:  Patients undergoing a surgery or procedure may have one family member or support person with them as long as that person is not COVID-19 positive or experiencing its symptoms.  That person may remain in the waiting area during the procedure.  Inpatient Visitation:    Visiting hours are 7 a.m. to 8 p.m. Inpatients will be allowed two visitors daily. The visitors may change each day during the patient's stay. No visitors under the age of 49. Any visitor under the age of 13 must be accompanied by an adult. The visitor must pass COVID-19 screenings, use hand sanitizer when entering and exiting the patient's room and wear a mask at all times, including in the patient's room. Patients must also wear a mask when staff or their visitor are in the room. Masking is required regardless of  vaccination status.

## 2021-04-07 ENCOUNTER — Other Ambulatory Visit: Payer: Self-pay

## 2021-04-07 ENCOUNTER — Other Ambulatory Visit
Admission: RE | Admit: 2021-04-07 | Discharge: 2021-04-07 | Disposition: A | Discharge status: Home / Self Care | Payer: Medicare Other | Source: Ambulatory Visit | Attending: Surgery | Admitting: Surgery

## 2021-04-07 DIAGNOSIS — I440 Atrioventricular block, first degree: Secondary | ICD-10-CM | POA: Diagnosis not present

## 2021-04-07 DIAGNOSIS — Z01818 Encounter for other preprocedural examination: Secondary | ICD-10-CM | POA: Diagnosis not present

## 2021-04-07 DIAGNOSIS — I452 Bifascicular block: Secondary | ICD-10-CM | POA: Diagnosis not present

## 2021-04-07 LAB — CBC
HCT: 36.3 % (ref 36.0–46.0)
Hemoglobin: 11.8 g/dL — ABNORMAL LOW (ref 12.0–15.0)
MCH: 27.4 pg (ref 26.0–34.0)
MCHC: 32.5 g/dL (ref 30.0–36.0)
MCV: 84.2 fL (ref 80.0–100.0)
Platelets: 191 10*3/uL (ref 150–400)
RBC: 4.31 MIL/uL (ref 3.87–5.11)
RDW: 14 % (ref 11.5–15.5)
WBC: 7 10*3/uL (ref 4.0–10.5)
nRBC: 0 % (ref 0.0–0.2)

## 2021-04-07 LAB — BASIC METABOLIC PANEL
Anion gap: 7 (ref 5–15)
BUN: 20 mg/dL (ref 8–23)
CO2: 28 mmol/L (ref 22–32)
Calcium: 9.1 mg/dL (ref 8.9–10.3)
Chloride: 106 mmol/L (ref 98–111)
Creatinine, Ser: 0.92 mg/dL (ref 0.44–1.00)
GFR, Estimated: >60 mL/min (ref >60)
Glucose, Bld: 102 mg/dL — ABNORMAL HIGH (ref 70–99)
Potassium: 4.1 mmol/L (ref 3.5–5.1)
Sodium: 141 mmol/L (ref 135–145)

## 2021-04-11 ENCOUNTER — Ambulatory Visit: Payer: Medicare Other | Admitting: Urgent Care

## 2021-04-11 ENCOUNTER — Encounter
Admission: RE | Disposition: A | Discharge status: Home / Self Care | Payer: Self-pay | Source: Home / Self Care | Attending: Surgery

## 2021-04-11 ENCOUNTER — Ambulatory Visit
Admission: RE | Admit: 2021-04-11 | Discharge: 2021-04-11 | Disposition: A | Discharge status: Home / Self Care | Payer: Medicare Other | Attending: Surgery | Admitting: Surgery

## 2021-04-11 ENCOUNTER — Ambulatory Visit: Payer: Medicare Other

## 2021-04-11 ENCOUNTER — Other Ambulatory Visit: Payer: Self-pay

## 2021-04-11 ENCOUNTER — Encounter: Payer: Self-pay | Admitting: Surgery

## 2021-04-11 DIAGNOSIS — M75102 Unspecified rotator cuff tear or rupture of left shoulder, not specified as traumatic: Secondary | ICD-10-CM | POA: Insufficient documentation

## 2021-04-11 DIAGNOSIS — Z79890 Hormone replacement therapy (postmenopausal): Secondary | ICD-10-CM | POA: Diagnosis not present

## 2021-04-11 DIAGNOSIS — Z7982 Long term (current) use of aspirin: Secondary | ICD-10-CM | POA: Diagnosis not present

## 2021-04-11 DIAGNOSIS — Z7951 Long term (current) use of inhaled steroids: Secondary | ICD-10-CM | POA: Insufficient documentation

## 2021-04-11 DIAGNOSIS — M19012 Primary osteoarthritis, left shoulder: Secondary | ICD-10-CM | POA: Insufficient documentation

## 2021-04-11 DIAGNOSIS — X58XXXA Exposure to other specified factors, initial encounter: Secondary | ICD-10-CM | POA: Diagnosis not present

## 2021-04-11 DIAGNOSIS — Z419 Encounter for procedure for purposes other than remedying health state, unspecified: Secondary | ICD-10-CM

## 2021-04-11 DIAGNOSIS — Z880 Allergy status to penicillin: Secondary | ICD-10-CM | POA: Diagnosis not present

## 2021-04-11 DIAGNOSIS — M7522 Bicipital tendinitis, left shoulder: Secondary | ICD-10-CM | POA: Insufficient documentation

## 2021-04-11 DIAGNOSIS — M75122 Complete rotator cuff tear or rupture of left shoulder, not specified as traumatic: Secondary | ICD-10-CM | POA: Insufficient documentation

## 2021-04-11 DIAGNOSIS — Z96653 Presence of artificial knee joint, bilateral: Secondary | ICD-10-CM | POA: Diagnosis not present

## 2021-04-11 DIAGNOSIS — M7592 Shoulder lesion, unspecified, left shoulder: Secondary | ICD-10-CM | POA: Insufficient documentation

## 2021-04-11 DIAGNOSIS — S43432A Superior glenoid labrum lesion of left shoulder, initial encounter: Secondary | ICD-10-CM | POA: Diagnosis not present

## 2021-04-11 DIAGNOSIS — Z79899 Other long term (current) drug therapy: Secondary | ICD-10-CM | POA: Insufficient documentation

## 2021-04-11 DIAGNOSIS — Z888 Allergy status to other drugs, medicaments and biological substances status: Secondary | ICD-10-CM | POA: Diagnosis not present

## 2021-04-11 HISTORY — PX: SHOULDER ARTHROSCOPY WITH ROTATOR CUFF REPAIR AND SUBACROMIAL DECOMPRESSION: SHX5686

## 2021-04-11 SURGERY — SHOULDER ARTHROSCOPY WITH ROTATOR CUFF REPAIR AND SUBACROMIAL DECOMPRESSION
Anesthesia: General | Site: Shoulder | Laterality: Left

## 2021-04-11 MED ORDER — PROPOFOL 10 MG/ML IV BOLUS
INTRAVENOUS | Status: AC
  Filled 2021-04-11: qty 20

## 2021-04-11 MED ORDER — ACETAMINOPHEN 10 MG/ML IV SOLN
INTRAVENOUS | Status: AC
  Filled 2021-04-11: qty 100

## 2021-04-11 MED ORDER — OXYCODONE HCL 5 MG PO TABS: 5.0000 mg | ORAL_TABLET | ORAL | 0 refills | Status: DC | PRN

## 2021-04-11 MED ORDER — METOCLOPRAMIDE HCL 10 MG PO TABS: 5.0000 mg | ORAL_TABLET | Freq: Three times a day (TID) | ORAL | Status: DC | PRN

## 2021-04-11 MED ORDER — POTASSIUM CHLORIDE IN NACL 20-0.9 MEQ/L-% IV SOLN
INTRAVENOUS | Status: DC
  Filled 2021-04-11 (×3): qty 1000

## 2021-04-11 MED ORDER — CHLORHEXIDINE GLUCONATE 0.12 % MT SOLN
OROMUCOSAL | Status: AC
  Administered 2021-04-11: 15 mL via OROMUCOSAL
  Filled 2021-04-11: qty 15

## 2021-04-11 MED ORDER — BUPIVACAINE HCL (PF) 0.5 % IJ SOLN
INTRAMUSCULAR | Status: DC | PRN
  Administered 2021-04-11: 10 mL via PERINEURAL

## 2021-04-11 MED ORDER — DEXAMETHASONE SODIUM PHOSPHATE 10 MG/ML IJ SOLN
INTRAMUSCULAR | Status: DC | PRN
  Administered 2021-04-11: 10 mg via INTRAVENOUS

## 2021-04-11 MED ORDER — BUPIVACAINE LIPOSOME 1.3 % IJ SUSP
INTRAMUSCULAR | Status: DC | PRN
  Administered 2021-04-11: 20 mL via PERINEURAL

## 2021-04-11 MED ORDER — ONDANSETRON HCL 4 MG PO TABS: 4.0000 mg | ORAL_TABLET | Freq: Four times a day (QID) | ORAL | Status: DC | PRN

## 2021-04-11 MED ORDER — MIDAZOLAM HCL 2 MG/2ML IJ SOLN
INTRAMUSCULAR | Status: AC
  Administered 2021-04-11: 1 mg via INTRAVENOUS
  Filled 2021-04-11: qty 2

## 2021-04-11 MED ORDER — ONDANSETRON HCL 4 MG/2ML IJ SOLN
INTRAMUSCULAR | Status: AC
  Filled 2021-04-11: qty 2

## 2021-04-11 MED ORDER — FENTANYL CITRATE (PF) 100 MCG/2ML IJ SOLN
INTRAMUSCULAR | Status: AC
  Administered 2021-04-11: 50 ug via INTRAVENOUS
  Filled 2021-04-11: qty 2

## 2021-04-11 MED ORDER — BUPIVACAINE-EPINEPHRINE (PF) 0.5% -1:200000 IJ SOLN
INTRAMUSCULAR | Status: DC | PRN
  Administered 2021-04-11: 30 mL

## 2021-04-11 MED ORDER — CHLORHEXIDINE GLUCONATE 0.12 % MT SOLN: 15.0000 mL | Freq: Once | OROMUCOSAL | Status: AC

## 2021-04-11 MED ORDER — PHENYLEPHRINE HCL-NACL 20-0.9 MG/250ML-% IV SOLN
INTRAVENOUS | Status: DC | PRN
  Administered 2021-04-11: 30 ug/min via INTRAVENOUS

## 2021-04-11 MED ORDER — BUPIVACAINE-EPINEPHRINE (PF) 0.5% -1:200000 IJ SOLN
INTRAMUSCULAR | Status: AC
  Filled 2021-04-11: qty 30

## 2021-04-11 MED ORDER — SUGAMMADEX SODIUM 200 MG/2ML IV SOLN
INTRAVENOUS | Status: DC | PRN
  Administered 2021-04-11: 200 mg via INTRAVENOUS

## 2021-04-11 MED ORDER — CEFAZOLIN SODIUM-DEXTROSE 2-4 GM/100ML-% IV SOLN
INTRAVENOUS | Status: AC
  Filled 2021-04-11: qty 100

## 2021-04-11 MED ORDER — LIDOCAINE HCL (PF) 2 % IJ SOLN
INTRAMUSCULAR | Status: AC
  Filled 2021-04-11: qty 4

## 2021-04-11 MED ORDER — FENTANYL CITRATE (PF) 100 MCG/2ML IJ SOLN: 25.0000 ug | INTRAMUSCULAR | Status: DC | PRN

## 2021-04-11 MED ORDER — ACETAMINOPHEN 10 MG/ML IV SOLN
INTRAVENOUS | Status: DC | PRN
  Administered 2021-04-11: 1000 mg via INTRAVENOUS

## 2021-04-11 MED ORDER — FENTANYL CITRATE (PF) 100 MCG/2ML IJ SOLN
INTRAMUSCULAR | Status: AC
  Filled 2021-04-11: qty 2

## 2021-04-11 MED ORDER — CEFAZOLIN SODIUM-DEXTROSE 2-4 GM/100ML-% IV SOLN
2.0000 g | Freq: Once | INTRAVENOUS | Status: AC
  Administered 2021-04-11: 2 g via INTRAVENOUS

## 2021-04-11 MED ORDER — ONDANSETRON HCL 4 MG/2ML IJ SOLN: 4.0000 mg | Freq: Once | INTRAMUSCULAR | Status: DC | PRN

## 2021-04-11 MED ORDER — PROPOFOL 10 MG/ML IV BOLUS
INTRAVENOUS | Status: DC | PRN
  Administered 2021-04-11: 30 mg via INTRAVENOUS
  Administered 2021-04-11: 110 mg via INTRAVENOUS

## 2021-04-11 MED ORDER — ONDANSETRON HCL 4 MG/2ML IJ SOLN: 4.0000 mg | Freq: Four times a day (QID) | INTRAMUSCULAR | Status: DC | PRN

## 2021-04-11 MED ORDER — ACETAMINOPHEN 325 MG PO TABS: 325.0000 mg | ORAL_TABLET | Freq: Four times a day (QID) | ORAL | Status: DC | PRN

## 2021-04-11 MED ORDER — SEVOFLURANE IN SOLN
RESPIRATORY_TRACT | Status: AC
  Filled 2021-04-11: qty 250

## 2021-04-11 MED ORDER — ROCURONIUM BROMIDE 10 MG/ML (PF) SYRINGE
PREFILLED_SYRINGE | INTRAVENOUS | Status: AC
  Filled 2021-04-11: qty 10

## 2021-04-11 MED ORDER — LACTATED RINGERS IV SOLN: INTRAVENOUS | Status: DC

## 2021-04-11 MED ORDER — METOCLOPRAMIDE HCL 5 MG/ML IJ SOLN: 5.0000 mg | Freq: Three times a day (TID) | INTRAMUSCULAR | Status: DC | PRN

## 2021-04-11 MED ORDER — ORAL CARE MOUTH RINSE: 15.0000 mL | Freq: Once | OROMUCOSAL | Status: AC

## 2021-04-11 MED ORDER — ROCURONIUM BROMIDE 100 MG/10ML IV SOLN
INTRAVENOUS | Status: DC | PRN
  Administered 2021-04-11: 20 mg via INTRAVENOUS
  Administered 2021-04-11: 50 mg via INTRAVENOUS

## 2021-04-11 MED ORDER — SODIUM CHLORIDE (PF) 0.9 % IJ SOLN
INTRAMUSCULAR | Status: AC
  Filled 2021-04-11: qty 50

## 2021-04-11 MED ORDER — OXYCODONE HCL 5 MG PO TABS: 5.0000 mg | ORAL_TABLET | ORAL | Status: DC | PRN

## 2021-04-11 MED ORDER — EPHEDRINE SULFATE 50 MG/ML IJ SOLN
INTRAMUSCULAR | Status: DC | PRN
  Administered 2021-04-11: 5 mg via INTRAVENOUS
  Administered 2021-04-11: 10 mg via INTRAVENOUS

## 2021-04-11 MED ORDER — PHENYLEPHRINE HCL (PRESSORS) 10 MG/ML IV SOLN
INTRAVENOUS | Status: DC | PRN
  Administered 2021-04-11 (×2): 200 ug via INTRAVENOUS

## 2021-04-11 MED ORDER — FENTANYL CITRATE (PF) 100 MCG/2ML IJ SOLN: 50.0000 ug | INTRAMUSCULAR | Status: AC

## 2021-04-11 MED ORDER — DEXAMETHASONE SODIUM PHOSPHATE 10 MG/ML IJ SOLN
INTRAMUSCULAR | Status: AC
  Filled 2021-04-11: qty 1

## 2021-04-11 MED ORDER — BUPIVACAINE LIPOSOME 1.3 % IJ SUSP
INTRAMUSCULAR | Status: AC
  Filled 2021-04-11: qty 20

## 2021-04-11 MED ORDER — MIDAZOLAM HCL 2 MG/2ML IJ SOLN: 1.0000 mg | INTRAMUSCULAR | Status: AC

## 2021-04-11 MED ORDER — LIDOCAINE HCL (CARDIAC) PF 100 MG/5ML IV SOSY
PREFILLED_SYRINGE | INTRAVENOUS | Status: DC | PRN
  Administered 2021-04-11: 80 mg via INTRAVENOUS

## 2021-04-11 MED ORDER — EPINEPHRINE PF 1 MG/ML IJ SOLN
INTRAMUSCULAR | Status: AC
  Filled 2021-04-11: qty 2

## 2021-04-11 MED ORDER — LIDOCAINE HCL (PF) 1 % IJ SOLN
INTRAMUSCULAR | Status: AC
  Filled 2021-04-11: qty 5

## 2021-04-11 MED ORDER — FENTANYL CITRATE (PF) 100 MCG/2ML IJ SOLN
INTRAMUSCULAR | Status: DC | PRN
  Administered 2021-04-11: 25 ug via INTRAVENOUS
  Administered 2021-04-11: 50 ug via INTRAVENOUS
  Administered 2021-04-11: 25 ug via INTRAVENOUS

## 2021-04-11 MED ORDER — ONDANSETRON HCL 4 MG/2ML IJ SOLN
INTRAMUSCULAR | Status: DC | PRN
  Administered 2021-04-11: 4 mg via INTRAVENOUS

## 2021-04-11 MED ORDER — LIDOCAINE HCL (PF) 1 % IJ SOLN
INTRAMUSCULAR | Status: DC | PRN
  Administered 2021-04-11: 3 mL
  Administered 2021-04-11: 80 mg

## 2021-04-11 SURGICAL SUPPLY — 50 items
ANCH SUT 5.5 KNTLS (Anchor) ×2 IMPLANT
ANCH SUT Q-FX 2.8 (Anchor) ×3 IMPLANT
ANCHOR ALL-SUT Q-FIX 2.8 (Anchor) ×6 IMPLANT
ANCHOR HEALICOIL REGEN 5.5 (Anchor) ×4 IMPLANT
APL PRP STRL LF DISP 70% ISPRP (MISCELLANEOUS) ×1
BIT DRILL JUGRKNT W/NDL BIT2.9 (DRILL) IMPLANT
BLADE FULL RADIUS 3.5 (BLADE) ×2 IMPLANT
BUR ACROMIONIZER 4.0 (BURR) ×2 IMPLANT
CANNULA SHAVER 8MMX76MM (CANNULA) ×2 IMPLANT
CHLORAPREP W/TINT 26 (MISCELLANEOUS) ×2 IMPLANT
COVER MAYO STAND REUSABLE (DRAPES) ×2 IMPLANT
COVER WAND RF STERILE (DRAPES) ×2 IMPLANT
DILATOR 5.5 THREADED HEALICOIL (MISCELLANEOUS) ×2 IMPLANT
DRAPE IMP U-DRAPE 54X76 (DRAPES) ×4 IMPLANT
DRILL JUGGERKNOT W/NDL BIT 2.9 (DRILL)
ELECT REM PT RETURN 9FT ADLT (ELECTROSURGICAL) ×2
ELECTRODE REM PT RTRN 9FT ADLT (ELECTROSURGICAL) ×1 IMPLANT
GAUZE SPONGE 4X4 12PLY STRL (GAUZE/BANDAGES/DRESSINGS) ×2 IMPLANT
GAUZE XEROFORM 1X8 LF (GAUZE/BANDAGES/DRESSINGS) ×2 IMPLANT
GLOVE SRG 8 PF TXTR STRL LF DI (GLOVE) ×1 IMPLANT
GLOVE SURG ENC MOIS LTX SZ7.5 (GLOVE) ×4 IMPLANT
GLOVE SURG ENC MOIS LTX SZ8 (GLOVE) ×4 IMPLANT
GLOVE SURG UNDER LTX SZ8 (GLOVE) ×2 IMPLANT
GLOVE SURG UNDER POLY LF SZ8 (GLOVE) ×2
GOWN STRL REUS W/ TWL LRG LVL3 (GOWN DISPOSABLE) ×1 IMPLANT
GOWN STRL REUS W/ TWL XL LVL3 (GOWN DISPOSABLE) ×1 IMPLANT
GOWN STRL REUS W/TWL LRG LVL3 (GOWN DISPOSABLE) ×2
GOWN STRL REUS W/TWL XL LVL3 (GOWN DISPOSABLE) ×2
GRASPER SUT 15 45D LOW PRO (SUTURE) IMPLANT
IV LACTATED RINGER IRRG 3000ML (IV SOLUTION) ×2
IV LR IRRIG 3000ML ARTHROMATIC (IV SOLUTION) ×1 IMPLANT
KIT SUTURE 2.8 Q-FIX DISP (MISCELLANEOUS) ×2 IMPLANT
MANIFOLD NEPTUNE II (INSTRUMENTS) ×4 IMPLANT
MASK FACE SPIDER DISP (MASK) ×2 IMPLANT
MAT ABSORB  FLUID 56X50 GRAY (MISCELLANEOUS) ×1
MAT ABSORB FLUID 56X50 GRAY (MISCELLANEOUS) ×1 IMPLANT
PACK ARTHROSCOPY SHOULDER (MISCELLANEOUS) ×2 IMPLANT
PASSER SUT FIRSTPASS SELF (INSTRUMENTS) ×2 IMPLANT
PENCIL SMOKE EVACUATOR (MISCELLANEOUS) IMPLANT
SLING ARM LRG DEEP (SOFTGOODS) IMPLANT
SLING ULTRA II LG (MISCELLANEOUS) IMPLANT
STAPLER SKIN PROX 35W (STAPLE) IMPLANT
STRAP SAFETY 5IN WIDE (MISCELLANEOUS) ×2 IMPLANT
SUT ETHIBOND 0 MO6 C/R (SUTURE) ×2 IMPLANT
SUT ULTRABRAID 2 COBRAID 38 (SUTURE) ×6 IMPLANT
SUT VIC AB 2-0 CT1 27 (SUTURE) ×2
SUT VIC AB 2-0 CT1 TAPERPNT 27 (SUTURE) ×1 IMPLANT
TAPE MICROFOAM 4IN (TAPE) ×2 IMPLANT
TUBING ARTHRO INFLOW-ONLY STRL (TUBING) ×2 IMPLANT
WAND WEREWOLF FLOW 90D (MISCELLANEOUS) ×2 IMPLANT

## 2021-04-11 NOTE — H&P (Signed)
History of Present Illness:  Emily Fox is a 80 y.o. female who presents today as a result of a referral by Dr. Lavenia Atlas for left shoulder pain and weakness.   The patient's symptoms began several months ago and developed without any specific cause or injury. She saw Dr. Gavin Potters who tried a steroid injection which provided little if any relief of her symptoms. Therefore, the patient was sent for an MRI scan and referred to me for further evaluation and treatment. The patient describes the symptoms as moderate (patient is active but has had to make modifications or give up activities) and have the quality of being aching, intermittent, nagging, stabbing, tender and throbbing. The pain is localized to the lateral arm/shoulder. These symptoms are aggravated with normal daily activities, with sleeping, at higher levels of activity, with overhead activity, reaching behind the back and exercising. She has tried acetaminophen and Aspercreme with limited benefit. She has tried rest and ice with no significant benefit. She has tried the 1 injection described above which provided little if any relief of her symptoms. She has not tried any formal physical therapy but has continued to try to perform a home exercise regimen, modifying certain exercises, also without any sustained relief. The patient denies any neck pain, nor did she note any numbness or paresthesias down her arm to her hand.  This complaint is not work related. She is a sports non-participant.  Shoulder Surgical History:  The patient has had no prior surgery on her left shoulder in the past.  PMH/PSH/Family History/Social History/Meds/Allergies:  I have reviewed past medical, surgical, social and family history, medications and allergies as documented in the EMR.  Current Outpatient Medications: . acetaminophen (TYLENOL) 500 MG tablet Take 1,000 mg by mouth 2 (two) times daily as needed.  Marland Kitchen albuterol 90 mcg/actuation inhaler Inhale 2  inhalations into the lungs every 4 (four) hours as needed 3 each 3  . aspirin 81 MG EC tablet Take 81 mg by mouth once daily.  . calcium carbonate/vitamin D3 (CALCIUM 500 + D, D3, ORAL) Take by mouth  . carvediloL (COREG) 6.25 MG tablet Take 1 tablet (6.25 mg total) by mouth 2 (two) times daily with meals 180 tablet 1  . cetirizine (ZYRTEC) 10 MG tablet Take 1 tablet (10 mg total) by mouth once daily 90 tablet 3  . clindamycin (CLEOCIN) 150 MG capsule once daily as needed (for teeth cleaning).  . cyanocobalamin, vitamin B-12, 1,000 mcg Cap Take 1 capsule by mouth once daily.  Marland Kitchen EPINEPHrine (EPIPEN) 0.3 mg/0.3 mL pen injector Inject 0.3 mLs into the muscle once as needed  . estradioL (ESTRACE) 0.01 % (0.1 mg/gram) vaginal cream Place vaginally nightly  . fluticasone propion-salmeteroL (ADVAIR HFA) 115-21 mcg/actuation inhaler Inhale 2 inhalations into the lungs every 12 (twelve) hours 36 g 3  . fluticasone propionate (FLONASE) 50 mcg/actuation nasal spray Place 2 sprays into both nostrils once daily as needed  . folic acid (FOLVITE) 1 MG tablet Take 1 tablet (1 mg total) by mouth once daily 90 tablet 0  . gabapentin (NEURONTIN) 100 MG capsule Take 1 capsule (100 mg total) by mouth 3 (three) times daily 90 capsule 3  . hyoscyamine (LEVSIN) 0.125 mg tablet Take 1 tablet (0.125 mg total) by mouth every 4 (four) hours as needed for Cramping 30 tablet 3  . INFLIXIMAB (REMICADE IV) Inject into the vein.  Francoise Schaumann 40-Bifido 3-S.thermop (PROBIOTIC) 100 billion cell Cap Take 1 capsule by mouth once daily.  Marland Kitchen  methotrexate (RHEUMATREX) 2.5 MG tablet Take 7 tablets (17.5 mg total) by mouth every 7 (seven) days 28 tablet 6  . montelukast (SINGULAIR) 10 mg tablet Take 1 tablet (10 mg total) by mouth nightly 90 tablet 3  . MULTIVITAMIN ORAL Take by mouth once daily.  Marland Kitchen omeprazole (PRILOSEC) 40 MG DR capsule Take 1 capsule (40 mg total) by mouth once daily 90 capsule 1  . oxybutynin (DITROPAN-XL) 5 MG XL tablet  5 mg once daily.  Marland Kitchen POTASSIUM GLUCONATE ORAL Take 1 tablet by mouth once daily  . TEMOVATE 0.05 % ointment APPLY TOPICALLY TWICE DAILY 1  . TORsemide (DEMADEX) 5 MG tablet Take 1 tablet (5 mg total) by mouth once daily 90 tablet 1  . triamcinolone 0.1 % cream Apply topically 2 (two) times daily 80 g 3  . triamterene-hydrochlorothiazide (MAXZIDE-25) 37.5-25 mg tablet Take 1 tablet by mouth once daily 90 tablet 1  . trolamine salicylate (ASPERCREME) 10 % cream Apply topically.  . vitamin E 400 UNIT capsule Take 400 Units by mouth once daily.   Allergies:  . Shellfish Containing Products Shortness Of Breath  . Synvisc [Hylan G-F 20] Shortness Of Breath  . Lisinopril Hallucination  . Penicillins Swelling   Past Medical History:  . Abdominal pain, recurrent 03/28/2018  . Asthma without status asthmaticus, unspecified  . B12 deficiency  . COPD (chronic obstructive pulmonary disease) (CMS-HCC)  . FH: colon polyps 03/28/2018  . GERD (gastroesophageal reflux disease)  . Hyperlipidemia (mild)  . Hypertension (Dx in late 1980's)  . Impaired fasting glucose 06/2013  . LLQ pain 08/10/2019  . Osteoarthritis  . Other irritable bowel syndrome 03/28/2018  . Perennial allergic rhinitis  . Psoriatic arthritis (CMS-HCC)   Past Surgical History:  . COLONOSCOPY 01/19/2013, 08/20/2007, 02/04/1989  FH Colon Polyps (Son): CBF 01/2018; Recall Ltr mailed 12/24/2017 (dh)  . COLONOSCOPY 06/23/2018 (FHCP (Sister) CBF 06/2023)  . EGD 04/29/2009, 01/23/1999 (No repeat per RTE)  . HYSTERECTOMY  . INGUINAL HERNIA REPAIR Left 09/2019 (robot assisted laparoscopic repair by Dr. Tonna Boehringer)  . KNEE ARTHROSCOPY Left  . Left total knee arthroplasty using computer navigation 12/05/2016 (Dr. Ernest Pine)  . Right medial unicompartmental knee arthroplasty 07/29/2013 (Dr. Gavin Potters)  . TONSILLECTOMY  . VEIN SURGERY Bilateral   Family History:  . Diabetes type II Mother  . Coronary Artery Disease (Blocked arteries around heart)  Mother  . Myocardial Infarction (Heart attack) Mother  . Lung disease Sister  . Diabetes type II Brother  . Lupus Brother  . Diabetes type II Brother  . Colon polyps Son  . Allergies Son  . Allergies Son  . Asthma Daughter   Social History:   Socioeconomic History:  Marland Kitchen Marital status: Married  Spouse name: Simonne Come  . Number of children: 3  . Years of education: 12  Occupational History  . Occupation: Retired  Tobacco Use  . Smoking status: Never Smoker  . Smokeless tobacco: Never Used  Vaping Use  . Vaping Use: Never used  Substance and Sexual Activity  . Alcohol use: No  . Drug use: No  . Sexual activity: Defer  Partners: Male   Review of Systems:  A comprehensive 14 point ROS was performed, reviewed, and the pertinent orthopaedic findings are documented in the HPI.  Physical Exam:  Vitals:  03/17/21 1235  BP: 122/86  Weight: 99.1 kg (218 lb 6.4 oz)  Height: 167.6 cm (5\' 6" )  PainSc: 5  PainLoc: Shoulder   General/Constitutional: Pleasant overweight elderly female in no acute distress.  Neuro/Psych: Normal mood and affect, oriented to person, place and time. Eyes: Non-icteric. Pupils are equal, round, and reactive to light, and exhibit synchronous movement. ENT: Unremarkable. Lymphatic: No palpable adenopathy. Respiratory: Lungs clear to auscultation, Normal chest excursion, No wheezes and Non-labored breathing Cardiovascular: Regular rate and rhythm. No murmurs. and No edema, swelling or tenderness, except as noted in detailed exam. Integumentary: No impressive skin lesions present, except as noted in detailed exam. Musculoskeletal: Unremarkable, except as noted in detailed exam.  Left shoulder exam: SKIN: normal SWELLING: none WARMTH: none LYMPH NODES: no adenopathy palpable CREPITUS: none TENDERNESS: Mildly tender along lateral acromion ROM (active):  Forward flexion: 100 degrees Abduction: 80 degrees Internal rotation: Left PSIS ROM (passive):  Forward  flexion: 135 degrees Abduction: 120 degrees  ER/IR at 90 abd: 90 degrees / 45 degrees  She has moderate pain with attempted forward flexion and abduction, as well as with maximal internal rotation at 90 degrees of abduction. She has mild pain with all other motions.  STRENGTH: Forward flexion: 3/5 Abduction: 3/5 External rotation: 3+-4/5 Internal rotation: 4/5 Pain with RC testing: Significant pain with attempted forward flexion and abduction, and mild-moderate pain with resisted external rotation  STABILITY: Normal  SPECIAL TESTS: Juanetta Gosling' test: positive, moderate Speed's test: Not evaluated Capsulitis - pain w/ passive ER: no Crossed arm test: Mildly positive Crank: Not evaluated Anterior apprehension: Negative Posterior apprehension: Not evaluated  She is neurovascularly intact to the upper extremity.  Imaging:  Shoulder X-Ray Imaging: Recent AP and Y-scapular views of the left shoulder are available for review and have been reviewed by myself. These films demonstrate mild-moderate degenerative changes as manifest by mild joint space narrowing and early osteophyte formation. The subacromial space is moderately decreased. There is no subacromial or infra-clavicular spurring. She demonstrates a Type II acromion.  Shoulder Imaging, MRI: Left Shoulder: MRI Shoulder Cartilage: Partial thickness humeral head cartilage loss. Partial thickness glenoid cartilage loss. MRI Shoulder Rotator Cuff: Full thickness tear of the infraspinatus. Retracted to the glenohumeral joint. Mild-moderate fatty atrophy of both supraspinatus and infraspinatus muscles. MRI Shoulder Labrum / Biceps: No labral tear or biceps abormality. MRI Shoulder Bone: Normal bone.  Both the films and report were reviewed by myself and discussed with the patient.  Assessment:  1. Nontraumatic massive full-thickness rotator cuff tear, left shoulder. 2.  Early degenerative joint disease, left shoulder.  Plan:  The  treatment options were discussed with the patient. In addition, patient educational materials were provided regarding the diagnosis and treatment options. The patient is quite frustrated by her symptoms and functional limitations, and is ready to consider more aggressive treatment options. After discussing various of surgical solutions, the patient would like to avoid a reverse total shoulder arthroplasty if at all possible. Therefore, I have recommended a left shoulder arthroscopy with debridement, decompression, and insertion of an InSpace device in order to try to alleviate her symptoms for a few years. The procedure was discussed with the patient, as were the potential risks (including bleeding, infection, nerve and/or blood vessel injury, persistent or recurrent pain, failure of the repair, progression of arthritis, need for further surgery, blood clots, strokes, heart attacks and/or arhythmias, pneumonia, etc.) and benefits. The patient states her understanding and wishes to proceed. All of the patient's questions and concerns were answered. She can call any time with further concerns. She will follow up post-surgery, routine.   H&P reviewed and patient re-examined. No changes.

## 2021-04-11 NOTE — Discharge Instructions (Addendum)
AMBULATORY SURGERY  DISCHARGE INSTRUCTIONS   1) The drugs that you were given will stay in your system until tomorrow so for the next 24 hours you should not:  A) Drive an automobile B) Make any legal decisions C) Drink any alcoholic beverage   2) You may resume regular meals tomorrow.  Today it is better to start with liquids and gradually work up to solid foods.  You may eat anything you prefer, but it is better to start with liquids, then soup and crackers, and gradually work up to solid foods.   3) Please notify your doctor immediately if you have any unusual bleeding, trouble breathing, redness and pain at the surgery site, drainage, fever, or pain not relieved by medication.    Information for Discharge Teaching:  DO NOT REMOVE TEAL  EXPAREL BRACELET FOR 4 DAYS (96 hours) EXPAREL (bupivacaine liposome injectable suspension)  Your surgeon or anesthesiologist gave you EXPAREL(bupivacaine) to help control your pain after surgery.  EXPAREL is a local anesthetic that provides pain relief by numbing the tissue around the surgical site. EXPAREL is designed to release pain medication over time and can control pain for up to 72 hours. Depending on how you respond to EXPAREL, you may require less pain medication during your recovery.  Possible side effects: Temporary loss of sensation or ability to move in the area where bupivacaine was injected. Nausea, vomiting, constipation Rarely, numbness and tingling in your mouth or lips, lightheadedness, or anxiety may occur. Call your doctor right away if you think you may be experiencing any of these sensations, or if you have other questions regarding possible side effects.  Follow all other discharge instructions given to you by your surgeon or nurse. Eat a healthy diet and drink plenty of water or other fluids.  4) If you return to the hospital for any reason within 96 hours following the administration of EXPAREL, it is important for  health care providers to know that you have received this anesthetic. A teal colored band has been placed on your arm with the date, time and amount of EXPAREL you have received in order to alert and inform your health care providers. Please leave this armband in place for the full 96 hours following administration, and then you may remove the band.        Please contact your physician with any problems or Same Day Surgery at [phone], Monday through Friday 6 am to 4 pm, or [place] at [facility] number at [phone].  Orthopedic discharge instructions: Keep dressing dry and intact.  May shower after dressing changed on post-op day #4 (Saturday).  Cover staples with Band-Aids after drying off. Apply ice frequently to shoulder. Take ibuprofen 600-800 mg TID OR Aleve 2 tabs BID with meals for 5-7 days, then as necessary. Take oxycodone as prescribed when needed.  May supplement with ES Tylenol if necessary. Keep shoulder immobilizer on at all times except may remove for bathing purposes. Follow-up in 10-14 days or as scheduled.

## 2021-04-11 NOTE — Anesthesia Preprocedure Evaluation (Signed)
Anesthesia Evaluation  Patient identified by MRN, date of birth, ID band Patient awake    Reviewed: Allergy & Precautions, NPO status , Patient's Chart, lab work & pertinent test results  History of Anesthesia Complications Negative for: history of anesthetic complications  Airway Mallampati: II  TM Distance: >3 FB Neck ROM: Full    Dental  (+) Missing, Poor Dentition   Pulmonary asthma , COPD,  COPD inhaler, former smoker,    breath sounds clear to auscultation- rhonchi (-) wheezing      Cardiovascular hypertension, Pt. on medications (-) CAD, (-) Past MI, (-) Cardiac Stents and (-) CABG  Rhythm:Regular Rate:Normal - Systolic murmurs and - Diastolic murmurs    Neuro/Psych neg Seizures negative neurological ROS  negative psych ROS   GI/Hepatic Neg liver ROS, GERD  ,  Endo/Other  negative endocrine ROSneg diabetes  Renal/GU negative Renal ROS     Musculoskeletal  (+) Arthritis ,   Abdominal (+) + obese,   Peds  Hematology negative hematology ROS (+)   Anesthesia Other Findings Past Medical History: No date: Asthma No date: Complication of anesthesia     Comment:  difficulty waking up in 1980's  No date: COPD (chronic obstructive pulmonary disease) (HCC) No date: Diverticula of colon No date: GERD (gastroesophageal reflux disease) No date: History of kidney stones No date: History of shingles No date: Hypertension No date: Increased BMI No date: Lichen sclerosus No date: Lower extremity edema No date: Menopause No date: Osteoarthritis No date: Overactive bladder No date: Psoriatic arthritis (HCC) No date: Seasonal allergies No date: Urinary incontinence   Reproductive/Obstetrics                            Anesthesia Physical Anesthesia Plan  ASA: II  Anesthesia Plan: General   Post-op Pain Management:  Regional for Post-op pain   Induction: Intravenous  PONV Risk Score  and Plan: 2 and Ondansetron, Dexamethasone and Midazolam  Airway Management Planned: Oral ETT  Additional Equipment:   Intra-op Plan:   Post-operative Plan: Extubation in OR  Informed Consent: I have reviewed the patients History and Physical, chart, labs and discussed the procedure including the risks, benefits and alternatives for the proposed anesthesia with the patient or authorized representative who has indicated his/her understanding and acceptance.     Dental advisory given  Plan Discussed with: CRNA and Anesthesiologist  Anesthesia Plan Comments:         Anesthesia Quick Evaluation

## 2021-04-11 NOTE — Op Note (Signed)
04/11/2021  1:38 PM  Patient:   Emily Fox  Pre-Op Diagnosis:   Massive rotator cuff tear with moderate degenerative joint disease, left shoulder.  Post-Op Diagnosis:   Massive rotator cuff tear, type II SLAP tear, moderate degenerative joint disease, and biceps tendinopathy, left shoulder.  Procedure:   Extensive arthroscopic debridement, arthroscopic subacromial decompression, and mini-open repair of massive rotator cuff tear, left shoulder.  Anesthesia:   General endotracheal with interscalene block using Exparel placed preoperatively by the anesthesiologist.  Surgeon:   Maryagnes Amos, MD  Assistant:   Horris Latino, PA-C  Findings:   As above.  There was a large type II SLAP tear extending from the 1030 to the 1:30 position.  There was a large/massive rotator cuff tear involving the superior portion of the subscapularis tendon, the entire supraspinatus tendon, and the entire infraspinatus tendon attachments.  The biceps tendon demonstrated moderate "lip sticking" without partial or full-thickness tearing.  There were grade 3-4 chondromalacial changes involving the central portion of the glenoid, and more diffuse grade 2 chondromalacial changes involving the humeral head.  Complications:   None  Fluids:   800 cc  Estimated blood loss:   10 cc  Tourniquet time:   None  Drains:   None  Closure:   Staples      Brief clinical note:   The patient is a 80 year old female with a history of progressively worsening pain and weakness of her left shoulder. The patient's symptoms have progressed despite medications, activity modification, etc. The patient's history and examination are consistent with impingement/tendinopathy with a large rotator cuff tear. These findings were confirmed by MRI scan. The scan also demonstrated moderate degenerative changes as well as biceps tendon pathology. The patient presents at this time for definitive management of these shoulder  symptoms.  Procedure:   The patient underwent placement of an interscalene block using Exparel by the anesthesiologist in the preoperative holding area before being brought into the operating room and lain in the supine position. The patient then underwent general endotracheal intubation and anesthesia before being repositioned in the beach chair position using the beach chair positioner. The left shoulder and upper extremity were prepped with ChloraPrep solution before being draped sterilely. Preoperative antibiotics were administered. A timeout was performed to confirm the proper surgical site before the expected portal sites and incision site were injected with 0.5% Sensorcaine with epinephrine.   A posterior portal was created and the glenohumeral joint thoroughly inspected with the findings as described above. An anterior portal was created using an outside-in technique. The labrum and rotator cuff were further probed, again confirming the above-noted findings. Areas of labral fraying were debrided back to stable margins using the full-radius resector, as were the torn margins of the rotator cuff and areas of synovitis. The ArthroCare wand was inserted and used to release the biceps tendon from its labral anchor. It also was used to obtain hemostasis as well as to "anneal" the labrum superiorly and anteriorly. Given the patient's age and size of the upper arm, it was deemed not necessary to perform a formal biceps tenodesis. The instruments were removed from the joint after suctioning the excess fluid.  The camera was repositioned through the posterior portal into the subacromial space. A separate lateral portal was created using an outside-in technique. The 3.5 mm full-radius resector was introduced and used to perform a subtotal bursectomy. The preoperative plan was to considerdevice, presuming that the rotator cuff tear would be irreparable. However, the tendon tissue  appeared to be of good quality and  was quite mobile. Therefore, the decision was made to proceed with a formal repair of the rotator cuff tear.   The ArthroCare wand was inserted and used to remove the periosteal tissue off the undersurface of the anterior third of the acromion as well as to recess the coracoacromial ligament from its attachment along the anterior and lateral margins of the acromion. The 4.0 mm acromionizing bur was introduced and used to complete the decompression by removing the undersurface of the anterior third of the acromion. The full radius resector was reintroduced to remove any residual bony debris before the ArthroCare wand was reintroduced to obtain hemostasis. The instruments were then removed from the subacromial space after suctioning the excess fluid.  An approximately 4-5 cm incision was made over the anterolateral aspect of the shoulder beginning at the anterolateral corner of the acromion and extending distally in line with the bicipital groove. This incision was carried down through the subcutaneous tissues to expose the deltoid fascia. The raphae between the anterior and middle thirds was identified and this plane developed to provide access into the subacromial space. Additional bursal tissues were debrided sharply using Metzenbaum scissors. The rotator cuff tear was readily identified. The margins were debrided sharply with a #15 blade and the exposed greater tuberosity roughened with a rongeur. The tear was repaired using three Smith [employer] Nephew 2.8 mm Q-Fix anchors. These sutures were then brought back laterally and secured using two Smith & Nephew Healicoil RegeneSorb anchors to create a two-layer closure. In addition, several #2 FiberWires were placed in a side-to-side fashion to close the longitudinal portion of the tear. An apparent watertight closure was obtained.  The wound was copiously irrigated with sterile saline solution before the deltoid raphae was reapproximated using 2-0 Vicryl interrupted  sutures. The subcutaneous tissues were closed in two layers using 2-0 Vicryl interrupted sutures before the skin was closed using staples. The portal sites also were closed using staples. A sterile bulky dressing was applied to the shoulder before the arm was placed into a shoulder immobilizer. The patient was then awakened, extubated, and returned to the recovery room in satisfactory condition after tolerating the procedure well.

## 2021-04-11 NOTE — Transfer of Care (Signed)
Immediate Anesthesia Transfer of Care Note  Patient: Emily Fox  Procedure(s) Performed: ARTHROSCOPY SHOULDER WITH DEBRIDEMENT, DECOMPRESSION, ROTATOR CUFF REPAIR (Left Shoulder)  Patient Location: PACU  Anesthesia Type:General  Level of Consciousness: drowsy  Airway & Oxygen Therapy: Patient Spontanous Breathing and Patient connected to face mask  Post-op Assessment: Report given to RN and Post -op Vital signs reviewed and stable  Post vital signs: stable  Last Vitals:  Vitals Value Taken Time  BP 147/58 04/11/21 1330  Temp 35.9 C 04/11/21 1330  Pulse 59 04/11/21 1331  Resp 16 04/11/21 1331  SpO2 100 % 04/11/21 1331  Vitals shown include unvalidated device data.  Last Pain:  Vitals:   04/11/21 0857  TempSrc: Temporal  PainSc: 3          Complications: No complications documented.

## 2021-04-11 NOTE — Anesthesia Procedure Notes (Signed)
Procedure Name: Intubation Date/Time: 04/11/2021 11:02 AM Performed by: Rodney Booze, CRNA Pre-anesthesia Checklist: Patient identified, Emergency Drugs available, Suction available and Patient being monitored Patient Re-evaluated:Patient Re-evaluated prior to induction Oxygen Delivery Method: Circle system utilized Preoxygenation: Pre-oxygenation with 100% oxygen Induction Type: IV induction Ventilation: Mask ventilation without difficulty Laryngoscope Size: Miller and 2 Grade View: Grade I Tube type: Oral Tube size: 7.0 mm Number of attempts: 1 Airway Equipment and Method: Stylet and Oral airway Placement Confirmation: ETT inserted through vocal cords under direct vision,  positive ETCO2 and breath sounds checked- equal and bilateral Secured at: 20 cm Tube secured with: Tape Dental Injury: Teeth and Oropharynx as per pre-operative assessment

## 2021-04-11 NOTE — Anesthesia Procedure Notes (Addendum)
Anesthesia Regional Block: Interscalene brachial plexus block   Pre-Anesthetic Checklist: ,, timeout performed, Correct Patient, Correct Site, Correct Laterality, Correct Procedure, Correct Position, site marked, Risks and benefits discussed,  Surgical consent,  Pre-op evaluation,  At surgeon's request and post-op pain management  Laterality: Left  Prep: chloraprep       Needles:  Injection technique: Single-shot  Needle Type: Stimiplex     Needle Length: 10cm  Needle Gauge: 21     Additional Needles:   Procedures:,,,, ultrasound used (permanent image in chart),,,,  Narrative:  Start time: 04/11/2021 9:45 AM End time: 04/11/2021 9:51 AM Injection made incrementally with aspirations every 5 mL.  Performed by: Personally  Anesthesiologist: Alver Fisher, MD  Additional Notes: Functioning IV was confirmed and monitors were applied.  A Stimuplex needle was used. Sterile prep and drape,hand hygiene and sterile gloves were used.  Negative aspiration and negative test dose prior to incremental administration of local anesthetic. The patient tolerated the procedure well.

## 2021-04-11 NOTE — Anesthesia Postprocedure Evaluation (Signed)
Anesthesia Post Note  Patient: Emily Fox  Procedure(s) Performed: ARTHROSCOPY SHOULDER WITH DEBRIDEMENT, DECOMPRESSION, ROTATOR CUFF REPAIR (Left Shoulder)  Patient location during evaluation: PACU Anesthesia Type: General Level of consciousness: awake and alert and oriented Pain management: pain level controlled Vital Signs Assessment: post-procedure vital signs reviewed and stable Respiratory status: spontaneous breathing, nonlabored ventilation and respiratory function stable Cardiovascular status: blood pressure returned to baseline and stable Postop Assessment: no signs of nausea or vomiting Anesthetic complications: no   No complications documented.   Last Vitals:  Vitals:   04/11/21 1330 04/11/21 1335  BP: (!) 147/58   Pulse: 60 60  Resp: 16 20  Temp: (!) 35.9 C   SpO2: 100% 100%    Last Pain:  Vitals:   04/11/21 1335  TempSrc:   PainSc: 0-No pain                 Simcha Farrington

## 2021-04-12 ENCOUNTER — Encounter: Payer: Self-pay | Admitting: Surgery

## 2021-04-24 ENCOUNTER — Ambulatory Visit
Admission: RE | Admit: 2021-04-24 | Discharge: 2021-04-24 | Disposition: A | Discharge status: Home / Self Care | Payer: Medicare Other | Source: Ambulatory Visit | Attending: Family Medicine | Admitting: Family Medicine

## 2021-04-24 ENCOUNTER — Other Ambulatory Visit: Payer: Self-pay | Admitting: Family Medicine

## 2021-04-24 ENCOUNTER — Other Ambulatory Visit: Payer: Self-pay

## 2021-04-24 DIAGNOSIS — S43432A Superior glenoid labrum lesion of left shoulder, initial encounter: Secondary | ICD-10-CM | POA: Insufficient documentation

## 2021-04-24 DIAGNOSIS — M79605 Pain in left leg: Secondary | ICD-10-CM | POA: Diagnosis present

## 2021-04-24 DIAGNOSIS — R0602 Shortness of breath: Secondary | ICD-10-CM | POA: Diagnosis present

## 2021-04-24 DIAGNOSIS — R2243 Localized swelling, mass and lump, lower limb, bilateral: Secondary | ICD-10-CM

## 2021-04-24 DIAGNOSIS — M79604 Pain in right leg: Secondary | ICD-10-CM | POA: Diagnosis present

## 2021-04-24 MED ORDER — IOHEXOL 350 MG/ML SOLN
75.0000 mL | Freq: Once | INTRAVENOUS | Status: AC | PRN
  Administered 2021-04-24: 75 mL via INTRAVENOUS

## 2021-05-12 ENCOUNTER — Other Ambulatory Visit: Payer: Self-pay | Admitting: Obstetrics and Gynecology

## 2021-07-26 ENCOUNTER — Other Ambulatory Visit: Payer: Self-pay | Admitting: Obstetrics and Gynecology

## 2022-01-26 ENCOUNTER — Other Ambulatory Visit: Payer: Self-pay

## 2022-01-26 ENCOUNTER — Ambulatory Visit (INDEPENDENT_AMBULATORY_CARE_PROVIDER_SITE_OTHER): Payer: Medicare Other | Admitting: Obstetrics and Gynecology

## 2022-01-26 ENCOUNTER — Encounter: Payer: Self-pay | Admitting: Obstetrics and Gynecology

## 2022-01-26 VITALS — BP 145/84 | HR 82 | Ht 63.0 in | Wt 207.6 lb

## 2022-01-26 DIAGNOSIS — L900 Lichen sclerosus et atrophicus: Secondary | ICD-10-CM | POA: Diagnosis not present

## 2022-01-26 DIAGNOSIS — Z01419 Encounter for gynecological examination (general) (routine) without abnormal findings: Secondary | ICD-10-CM

## 2022-01-26 NOTE — Progress Notes (Signed)
Patients presents for annual exam today. Patient states doing well today. She states she occasionally has vaginal burning which she is treating with Temovate. Patient states no other questions or concerns at this time.  ? ?

## 2022-01-26 NOTE — Progress Notes (Signed)
HPI:      [name] is a 81 y.o. [phone] who LMP was No LMP recorded. Patient has had a hysterectomy.  Subjective:   She presents today for her annual examination.  She says she is generally doing well.  Her shoulder is much better since her surgery last year for rotator cuff.  She does report that she has been under increased stress recently because her husband has heart failure, kidney failure and significant diabetes and continues to refuse to go to his doctor appointments.  She states that he is getting decubitus ulcers on his legs and his back where she says the skin is "falling off" but continues to refuse to go to the doctor. She did stop using the clobetasol for a short time because she was asymptomatic.  She has recently begun to have more symptoms and has restarted its use. She says [facility] and she really enjoys this.    Hx: The following portions of the patient's history were reviewed and updated as appropriate:             She  has a past medical history of Asthma, Complication of anesthesia, COPD (chronic obstructive pulmonary disease) (HCC), Diverticula of colon, GERD (gastroesophageal reflux disease), History of kidney stones, History of shingles, Hypertension, Increased BMI, Lichen sclerosus, Lower extremity edema, Menopause, Osteoarthritis, Overactive bladder, Psoriatic arthritis (HCC), Seasonal allergies, and Urinary incontinence. She does not have any pertinent problems on file. She  has a past surgical history that includes knee replacement; Vaginal hysterectomy; Tonsillectomy; Ganglion cyst excision; lower leg fracture; Knee Arthroplasty (Left, 12/05/2016); Colonoscopy with propofol (N/A, 06/23/2018); XI robotic assisted ventral hernia (N/A, 10/02/2019); Fracture surgery; Joint replacement; and Shoulder arthroscopy with rotator cuff repair and subacromial decompression (Left, 04/11/2021). Her family history includes Diabetes in  her mother; Heart disease in her mother. She  reports that she quit smoking about 53 years ago. Her smoking use included cigarettes. She has never used smokeless tobacco. She reports that she does not drink alcohol and does not use drugs. She has a current medication list which includes the following prescription(s): acetaminophen, albuterol, aspirin ec, calcium-vitamin d, cetirizine, b-12, epinephrine, estradiol, fluticasone, fluticasone-salmeterol, folic acid, ibuprofen, infliximab, methotrexate, montelukast, multivitamin with minerals, omeprazole, potassium gluconate, probiotic product, temovate, triamterene-hydrochlorothiazide, trolamine salicylate, vitamin e, carvedilol, and torsemide, and the following Facility-Administered Medications: clobetasol ointment. She is allergic to hylan g-f 20, nutmeg oil (myristica oil), lisinopril, cabbage, and penicillins.       Review of Systems:  Review of Systems  Constitutional: Denied constitutional symptoms, night sweats, recent illness, fatigue, fever, insomnia and weight loss.  Eyes: Denied eye symptoms, eye pain, photophobia, vision change and visual disturbance.  Ears/Nose/Throat/Neck: Denied ear, nose, throat or neck symptoms, hearing loss, nasal discharge, sinus congestion and sore throat.  Cardiovascular: Denied cardiovascular symptoms, arrhythmia, chest pain/pressure, edema, exercise intolerance, orthopnea and palpitations.  Respiratory: Denied pulmonary symptoms, asthma, pleuritic pain, productive sputum, cough, dyspnea and wheezing.  Gastrointestinal: Denied, gastro-esophageal reflux, melena, nausea and vomiting.  Genitourinary: Denied genitourinary symptoms including symptomatic vaginal discharge, pelvic relaxation issues, and urinary complaints.  Musculoskeletal: Denied musculoskeletal symptoms, stiffness, swelling, muscle weakness and myalgia.  Dermatologic: Denied dermatology symptoms, rash and scar.  Neurologic: Denied neurology symptoms,  dizziness, headache, neck pain and syncope.  Psychiatric: Denied psychiatric symptoms, anxiety and depression.  Endocrine: Denied endocrine symptoms including hot flashes and night sweats.   Meds:   Current Outpatient Medications on File Prior to  Visit  Medication Sig Dispense Refill   acetaminophen (TYLENOL) 500 MG tablet Take 1,000 mg by mouth every 8 (eight) hours as needed for moderate pain.     albuterol (VENTOLIN HFA) 108 (90 Base) MCG/ACT inhaler Inhale 2 puffs into the lungs every 4 (four) hours as needed for wheezing or shortness of breath.      aspirin EC 81 MG tablet Take 81 mg by mouth daily.     calcium-vitamin D (OSCAL WITH D) 500-200 MG-UNIT tablet Take 1 tablet by mouth daily with breakfast.     cetirizine (ZYRTEC) 10 MG tablet Take 10 mg by mouth at bedtime.     Cyanocobalamin (B-12) 1000 MCG CAPS Take 1,000 mcg by mouth daily.      EPINEPHrine 0.3 mg/0.3 mL IJ SOAJ injection Inject 0.3 mg into the muscle as needed for anaphylaxis.     estradiol (ESTRACE) 0.1 MG/GM vaginal cream Place 0.5 Applicatorfuls vaginally 3 (three) times a week. (Patient taking differently: Place 0.5 Applicatorfuls vaginally once a week.) 42.5 g 2   fluticasone (FLONASE) 50 MCG/ACT nasal spray Place 2 sprays into both nostrils daily as needed for allergies.      fluticasone-salmeterol (ADVAIR HFA) 115-21 MCG/ACT inhaler Inhale 2 puffs into the lungs 2 (two) times daily.     folic acid (FOLVITE) 1 MG tablet Take 1 mg by mouth daily.     ibuprofen (ADVIL) 800 MG tablet Take 1 tablet (800 mg total) by mouth every 8 (eight) hours as needed for mild pain or moderate pain. 30 tablet 0   inFLIXimab (REMICADE) 100 MG injection Inject into the vein every 6 (six) weeks.     methotrexate (RHEUMATREX) 2.5 MG tablet Take 17.5 mg by mouth every Friday. Caution:Chemotherapy. Protect from light.  6 tablets on Fridays     montelukast (SINGULAIR) 10 MG tablet Take 10 mg by mouth at bedtime.     Multiple Vitamin  (MULTIVITAMIN WITH MINERALS) TABS tablet Take 1 tablet by mouth daily.     omeprazole (PRILOSEC) 40 MG capsule Take 40 mg by mouth daily.     potassium gluconate 595 (99 K) MG TABS tablet Take 595 mg by mouth daily.     Probiotic Product (PROBIOTIC PO) Take 1 tablet by mouth 2 (two) times daily.      TEMOVATE [id number] % Apply 1 application topically daily. 30 g 2   triamterene-hydrochlorothiazide (MAXZIDE-25) 37.5-25 MG tablet Take 1 tablet by mouth every 14 (fourteen) days.     trolamine salicylate (ASPERCREME) 10 % cream Apply 1 application topically 3 (three) times daily as needed for muscle pain.      vitamin E 180 MG (400 UNITS) capsule Take 400 Units by mouth daily.     carvedilol (COREG) 6.25 MG tablet Take 6.25 mg by mouth 2 (two) times daily.      torsemide (DEMADEX) 5 MG tablet Take 5 mg by mouth daily.      Current Facility-Administered Medications on File Prior to Visit  Medication Dose Route Frequency Provider Last Rate Last Admin   clobetasol ointment (TEMOVATE) 0.05 %   Topical Once per day on Mon Thu Olivia Royse James, MD           Objective:     Vitals:   01/26/22 0939  BP: (!) 145/84  Pulse: 82    Filed Weights   01/26/22 0939  Weight: 207 lb 9.6 oz (94.2 kg)              Physical examination General  NAD, Conversant  HEENT Atraumatic; Op clear with mmm.  Normo-cephalic. Pupils reactive. Anicteric sclerae  Thyroid/Neck Smooth without nodularity or enlargement. Normal ROM.  Neck Supple.  Skin No rashes, lesions or ulceration. Normal palpated skin turgor. No nodularity.  Breasts: No masses or discharge.  Symmetric.  No axillary adenopathy.  Lungs: Clear to auscultation.No rales or wheezes. Normal Respiratory effort, no retractions.  Heart: NSR.  No murmurs or rubs appreciated. No periferal edema  Abdomen: Soft.  Non-tender.  No masses.  No HSM. No hernia  Extremities: Moves all appropriately.  Normal ROM for age. No lymphadenopathy.  Neuro: Oriented to PPT.   Normal mood. Normal affect.     Pelvic:   Vulva: Significant atrophic/lichen sclerosus changes including fusion of the anterior labia.   Vagina: No lesions or abnormalities noted.  Support: Normal pelvic support.  Urethra No masses tenderness or scarring.  Meatus Normal size without lesions or prolapse.  Cervix: Surgically absent  Anus: Normal exam.  No lesions.  Perineum: Normal exam.  No lesions.     Assessment:    [id number] Patient Active Problem List   Diagnosis Date Noted   Stress due to illness of family member 05/08/2018   S/P total knee arthroplasty [id number]   Lichen sclerosus [id number]   Vaginal atrophy 10/27/2015   Unstable bladder 10/27/2015   Adult BMI 30+ 10/01/2014   Arthritis, degenerative 03/08/2014   Asthma without status asthmaticus 02/28/2014   B12 deficiency 02/28/2014   Accumulation of fluid in tissues 02/28/2014   Acid reflux 02/28/2014   HLD (hyperlipidemia) 02/28/2014   BP (high blood pressure) 02/28/2014   Other allergic rhinitis 02/28/2014   Psoriatic arthritis ([place]) 02/28/2014     1. Well woman exam with routine gynecological exam   2. Lichen sclerosus     Lichen sclerosus seems to be a little bit worse but this is not surprising considering patient stopped use of clobetasol for several months.  She has now restarted.   Plan:            1.  Basic Screening Recommendations The basic screening recommendations for asymptomatic women were discussed with the patient during her visit.  The age-appropriate recommendations were discussed with her and the rational for the tests reviewed.  When I am informed by the patient that another primary care physician has previously obtained the age-appropriate tests and they are up-to-date, only outstanding tests are ordered and referrals given as necessary.  Abnormal results of tests will be discussed with her when all of her results are completed.  Routine preventative health maintenance measures emphasized:  Exercise/Diet/Weight control, Tobacco Warnings, Alcohol/Substance use risks and Stress Management 2.  Recommendation for continued systematic use of clobetasol. Orders No orders of the defined types were placed in this encounter.   No orders of the defined types were placed in this encounter.        F/U  Return in about 1 year (around 01/27/2023) for Annual Physical.  Emily Fox, M.D. 01/26/2022 10:10 AM

## 2022-06-26 ENCOUNTER — Other Ambulatory Visit: Payer: Self-pay | Admitting: Internal Medicine

## 2022-06-26 DIAGNOSIS — I208 Other forms of angina pectoris: Secondary | ICD-10-CM

## 2022-06-26 DIAGNOSIS — R0789 Other chest pain: Secondary | ICD-10-CM

## 2022-06-27 ENCOUNTER — Telehealth (HOSPITAL_COMMUNITY): Payer: Self-pay | Admitting: Emergency Medicine

## 2022-06-27 NOTE — Telephone Encounter (Signed)
Attempted to call patient regarding upcoming cardiac CT appointment. °Left message on voicemail with name and callback number °Japji Kok RN Navigator Cardiac Imaging °[facility] °[phone] Office °[phone] Cell ° °

## 2022-06-28 ENCOUNTER — Ambulatory Visit
Admission: RE | Admit: 2022-06-28 | Discharge: 2022-06-28 | Disposition: A | Discharge status: Home / Self Care | Payer: Medicare Other | Source: Ambulatory Visit | Attending: Internal Medicine | Admitting: Internal Medicine

## 2022-06-28 DIAGNOSIS — I208 Other forms of angina pectoris: Secondary | ICD-10-CM | POA: Insufficient documentation

## 2022-06-28 DIAGNOSIS — R0789 Other chest pain: Secondary | ICD-10-CM | POA: Insufficient documentation

## 2022-06-28 MED ORDER — METOPROLOL TARTRATE 5 MG/5ML IV SOLN
10.0000 mg | Freq: Once | INTRAVENOUS | Status: AC
  Administered 2022-06-28: 10 mg via INTRAVENOUS

## 2022-06-28 MED ORDER — IOHEXOL 350 MG/ML SOLN
87.0000 mL | Freq: Once | INTRAVENOUS | Status: AC | PRN
  Administered 2022-06-28: 87 mL via INTRAVENOUS

## 2022-06-28 MED ORDER — NITROGLYCERIN 0.4 MG SL SUBL
0.8000 mg | SUBLINGUAL_TABLET | Freq: Once | SUBLINGUAL | Status: AC
  Administered 2022-06-28: 0.8 mg via SUBLINGUAL

## 2022-06-28 NOTE — Progress Notes (Signed)
Patient tolerated procedure well. Ambulate w/o difficulty. Denies light headedness or being dizzy. Sitting in chair drinking water provided. Encouraged to drink extra water today and reasoning explained. Verbalized understanding. All questions answered. ABC intact. No further needs. Discharge from procedure area w/o issues.   °

## 2022-10-25 ENCOUNTER — Other Ambulatory Visit: Payer: Self-pay | Admitting: Obstetrics and Gynecology

## 2022-10-25 NOTE — Telephone Encounter (Signed)
Duplicate request, already approved and sent

## 2022-12-19 ENCOUNTER — Other Ambulatory Visit: Payer: Self-pay

## 2022-12-19 LAB — HM MAMMOGRAPHY

## 2023-01-01 IMAGING — CT CT ANGIO CHEST
2 of 6 series · 18 of 46 positions shown · IV contrast (APPLIED)
Comparison: Chest x-ray 05/04/2013

CLINICAL DATA: Cough and bilateral leg swelling. History of
shoulder surgery in Friday March, 2021

EXAM:
CT ANGIOGRAPHY CHEST WITH CONTRAST
TECHNIQUE: Multidetector CT imaging of the chest was performed using the
standard protocol during bolus administration of intravenous
contrast. Multiplanar CT image reconstructions and MIPs were
obtained to evaluate the vascular anatomy.
CONTRAST:  75mL OMNIPAQUE IOHEXOL 350 MG/ML SOLN

[Series 5: thins · axial · 0.67mm/px · z∈[-267,-51]mm · 16 of 238 slices shown]
[im 11/238  lung]
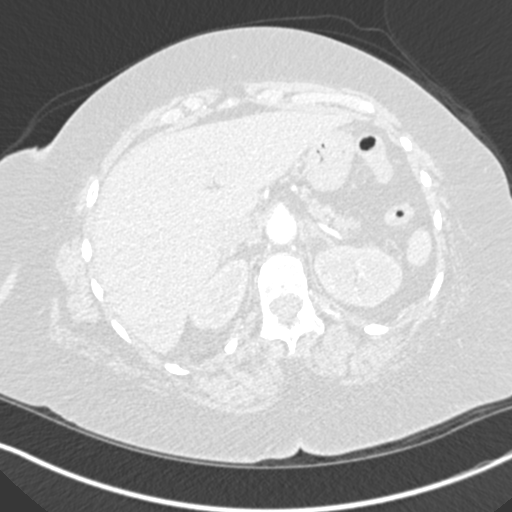
[im 31/238  soft-tissue]
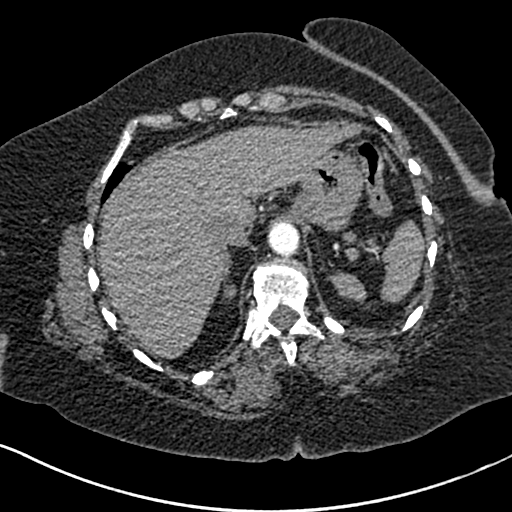
[im 42/238  lung]
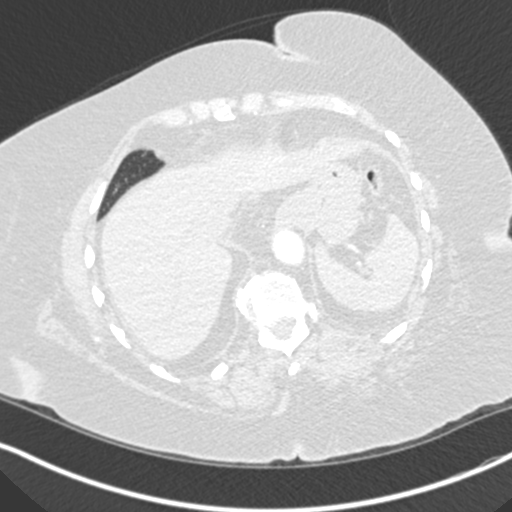
[im 52/238  soft-tissue]
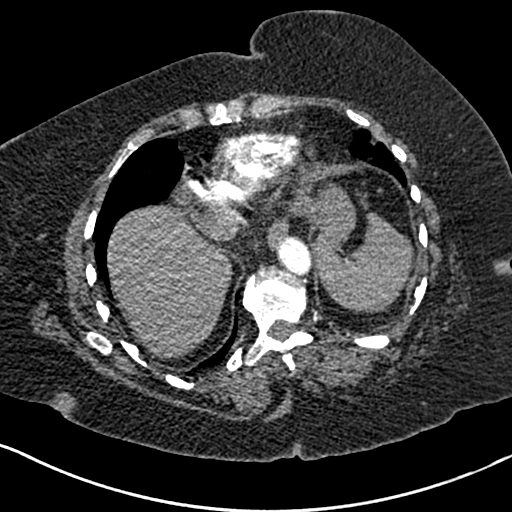
[im 73/238  lung]
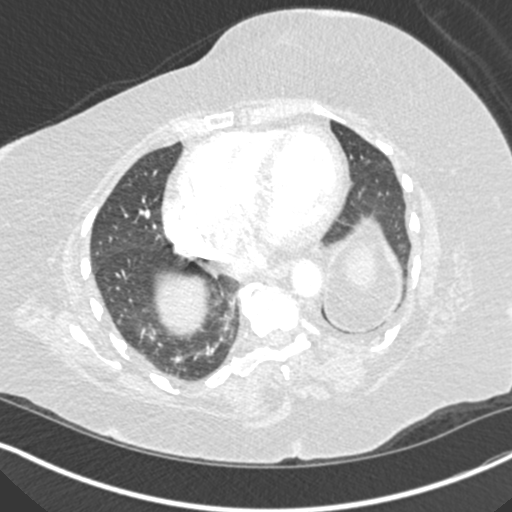
[im 83/238  soft-tissue]
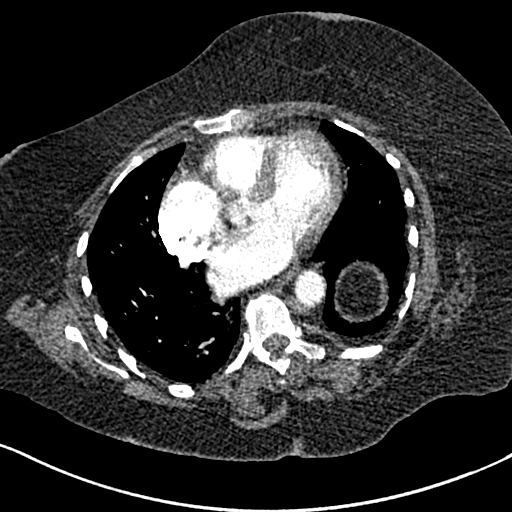
[im 93/238  lung]
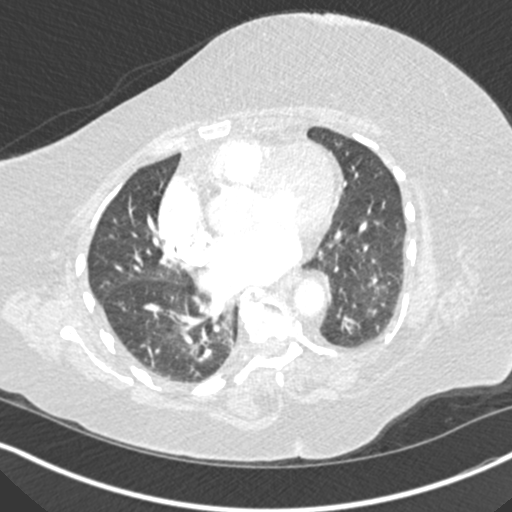
[im 114/238  soft-tissue]
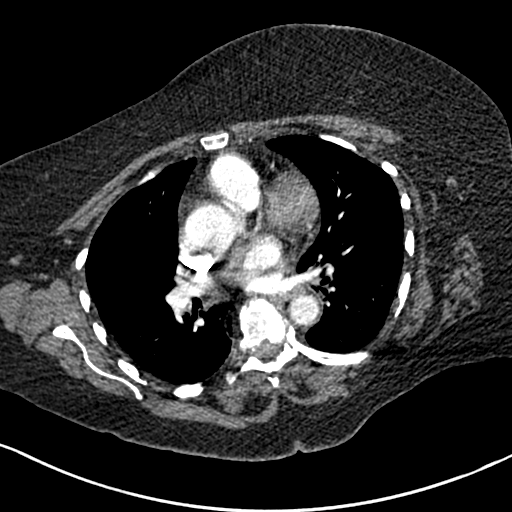
[im 124/238  lung]
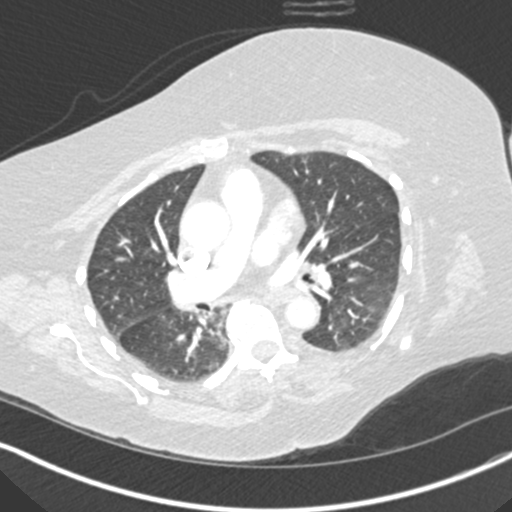
[im 145/238  soft-tissue]
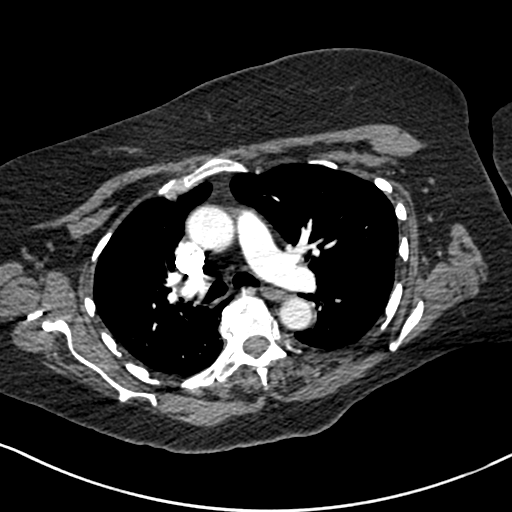
[im 155/238  lung]
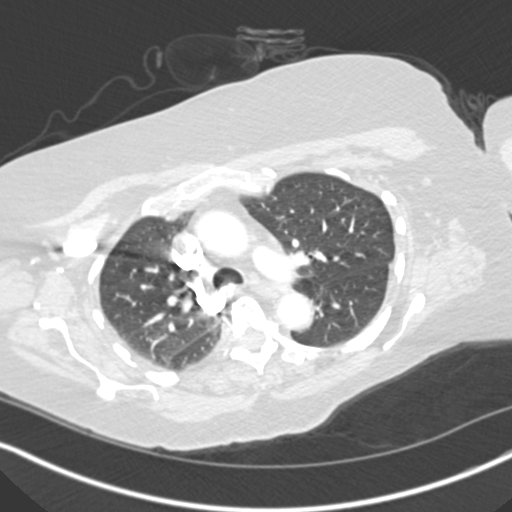
[im 165/238  soft-tissue]
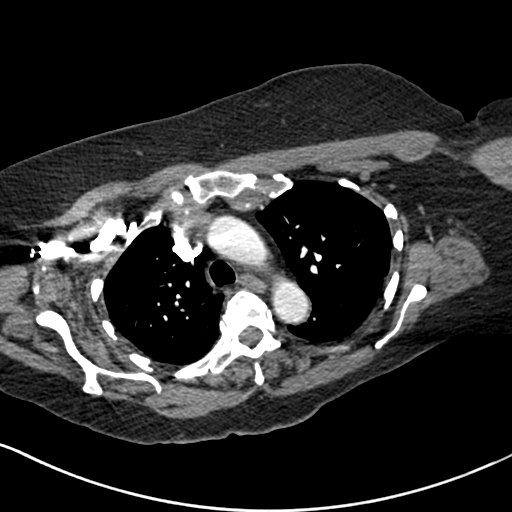
[im 186/238  lung]
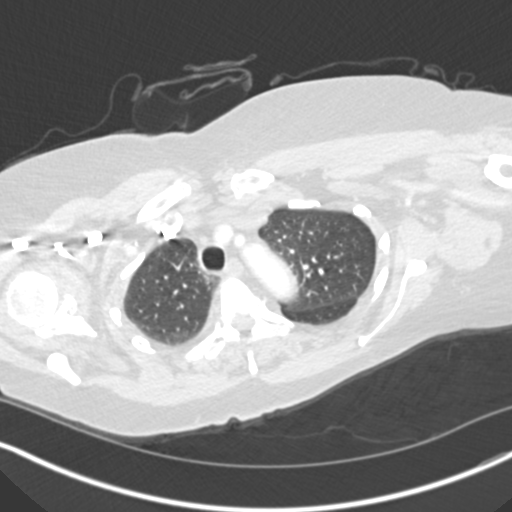
[im 196/238  soft-tissue]
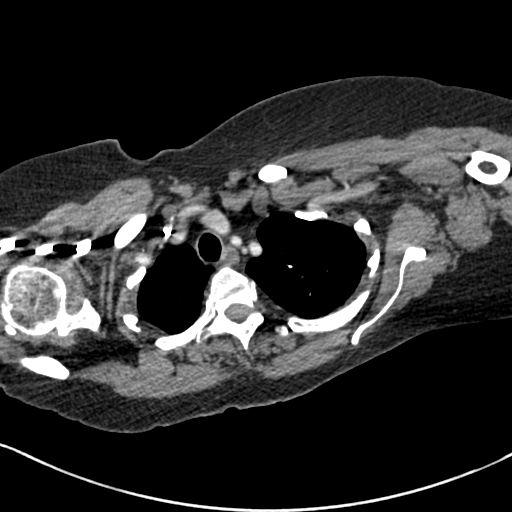
[im 207/238  lung]
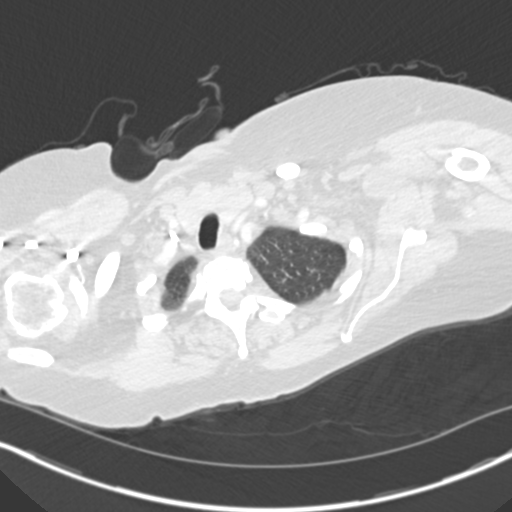
[im 227/238  soft-tissue]
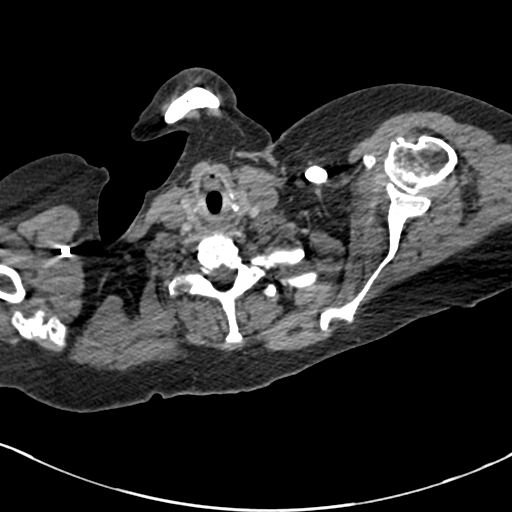

[Series 7: coronal mpr · coronal · 0.51mm/px · 2 of 82 slices shown]
[im 28/82  soft-tissue]
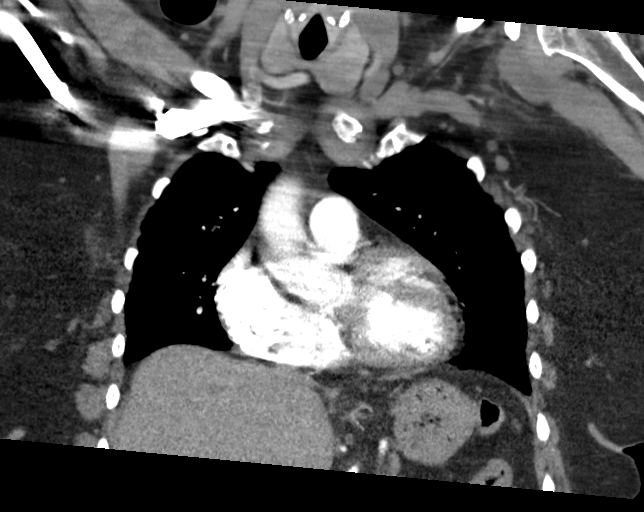
[im 55/82  soft-tissue]
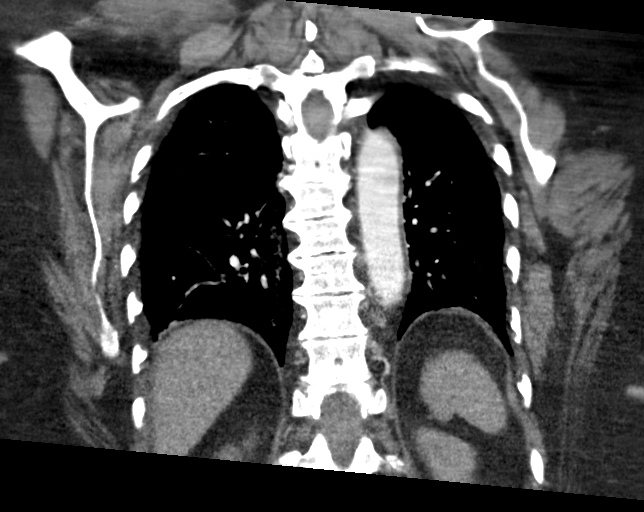

[18 of 46 positions shown; findings below may reference images not displayed]

FINDINGS: Cardiovascular: Satisfactory opacification of the pulmonary
arteries. Evaluation of the segmental and subsegmental branches is
limited by respiratory motion artifact as well as beam hardening
artifact related to body habitus and the positioning of the
patient's arm. No evidence of pulmonary embolism to the lobar branch
level. Thoracic aorta is nonaneurysmal. Four vessel arch. Scattered
atherosclerotic calcification of the aorta and coronary arteries.
Normal heart size. No pericardial effusion.

Mediastinum/Nodes: No enlarged mediastinal, hilar, or axillary lymph
nodes. Thyroid gland, trachea, and esophagus demonstrate no
significant findings.

Lungs/Pleura: Mild right basilar atelectasis. The lungs are
otherwise clear. No pleural effusion or pneumothorax.

Upper Abdomen: No acute abnormality.

Musculoskeletal: No chest wall abnormality. No acute or significant
osseous findings. Degenerative changes of the bilateral shoulders,
right worse than left. Degenerative endplate spurring throughout the
thoracic spine.

Review of the MIP images confirms the above findings.
IMPRESSION: 1. No evidence of pulmonary embolism to the lobar branch level.
2. Mild right basilar atelectasis.  Lungs otherwise clear.
3. Aortic and coronary artery atherosclerosis ([id number]).

## 2023-01-29 ENCOUNTER — Encounter: Payer: Medicare Other | Admitting: Obstetrics and Gynecology

## 2023-01-29 ENCOUNTER — Encounter: Payer: Self-pay | Admitting: Obstetrics and Gynecology

## 2023-01-29 ENCOUNTER — Ambulatory Visit (INDEPENDENT_AMBULATORY_CARE_PROVIDER_SITE_OTHER): Payer: Medicare Other | Admitting: Obstetrics and Gynecology

## 2023-01-29 VITALS — BP 146/89 | HR 61 | Ht 63.0 in | Wt 207.5 lb

## 2023-01-29 DIAGNOSIS — L900 Lichen sclerosus et atrophicus: Secondary | ICD-10-CM

## 2023-01-29 DIAGNOSIS — N952 Postmenopausal atrophic vaginitis: Secondary | ICD-10-CM | POA: Diagnosis not present

## 2023-01-29 MED ORDER — TEMOVATE 0.05 % EX CREA: 1.0000 | TOPICAL_CREAM | Freq: Every day | CUTANEOUS | 4 refills | Status: DC

## 2023-01-29 MED ORDER — ESTRADIOL 0.1 MG/GM VA CREA: TOPICAL_CREAM | VAGINAL | 3 refills | Status: DC

## 2023-01-29 NOTE — Progress Notes (Signed)
HPI:      [name] is a 82 y.o. [phone] who LMP was No LMP recorded. Patient has had a hysterectomy.  Subjective:   She presents today for follow-up of her lichen sclerosus.  At her last visit I had noticed some progression because she was not using the clobetasol.  She states that since that time she is only intermittently use the clobetasol.  She is spending most of her time caring for her husband.  She notes an occasional small amount of burning but reports nothing significant.    Hx: The following portions of the patient's history were reviewed and updated as appropriate:             She  has a past medical history of Asthma, Complication of anesthesia, COPD (chronic obstructive pulmonary disease) ([place]), Diverticula of colon, GERD (gastroesophageal reflux disease), History of kidney stones, History of shingles, Hypertension, Increased BMI, Lichen sclerosus, Lower extremity edema, Menopause, Osteoarthritis, Overactive bladder, Psoriatic arthritis ([place]), Seasonal allergies, and Urinary incontinence. She does not have any pertinent problems on file. She  has a past surgical history that includes knee replacement; Vaginal hysterectomy; Tonsillectomy; Ganglion cyst excision; lower leg fracture; Knee Arthroplasty (Left, 12/05/2016); Colonoscopy with propofol (N/A, 06/23/2018); XI robotic assisted ventral hernia (N/A, 10/02/2019); Fracture surgery; Joint replacement; and Shoulder arthroscopy with rotator cuff repair and subacromial decompression (Left, 04/11/2021). Her family history includes Diabetes in her mother; Heart disease in her mother. She  reports that she quit smoking about 54 years ago. Her smoking use included cigarettes. She has never used smokeless tobacco. She reports that she does not drink alcohol and does not use drugs. She has a current medication list which includes the following prescription(s): acetaminophen, albuterol, ascorbic acid, aspirin ec, calcium-vitamin d,  cetirizine, b-12, epinephrine, fluticasone, fluticasone-salmeterol, folic acid, ibuprofen, infliximab, methotrexate, montelukast, multivitamin with minerals, omeprazole, potassium gluconate, probiotic product, triamterene-hydrochlorothiazide, trolamine salicylate, vitamin e, carvedilol, estradiol, temovate, and torsemide, and the following Facility-Administered Medications: clobetasol ointment. She is allergic to hylan g-f 20, nutmeg oil (myristica oil), lisinopril, cabbage, and penicillins.       Review of Systems:  Review of Systems  Constitutional: Denied constitutional symptoms, night sweats, recent illness, fatigue, fever, insomnia and weight loss.  Eyes: Denied eye symptoms, eye pain, photophobia, vision change and visual disturbance.  Ears/Nose/Throat/Neck: Denied ear, nose, throat or neck symptoms, hearing loss, nasal discharge, sinus congestion and sore throat.  Cardiovascular: Denied cardiovascular symptoms, arrhythmia, chest pain/pressure, edema, exercise intolerance, orthopnea and palpitations.  Respiratory: Denied pulmonary symptoms, asthma, pleuritic pain, productive sputum, cough, dyspnea and wheezing.  Gastrointestinal: Denied, gastro-esophageal reflux, melena, nausea and vomiting.  Genitourinary: See HPI for additional information.  Musculoskeletal: Denied musculoskeletal symptoms, stiffness, swelling, muscle weakness and myalgia.  Dermatologic: Denied dermatology symptoms, rash and scar.  Neurologic: Denied neurology symptoms, dizziness, headache, neck pain and syncope.  Psychiatric: Denied psychiatric symptoms, anxiety and depression.  Endocrine: Denied endocrine symptoms including hot flashes and night sweats.   Meds:   Current Outpatient Medications on File Prior to Visit  Medication Sig Dispense Refill   acetaminophen (TYLENOL) 500 MG tablet Take 1,000 mg by mouth every 8 (eight) hours as needed for moderate pain.     albuterol (VENTOLIN HFA) 108 (90 Base) MCG/ACT  inhaler Inhale 2 puffs into the lungs every 4 (four) hours as needed for wheezing or shortness of breath.      ascorbic acid (VITAMIN C) 500 MG tablet Take 500 mg by mouth daily.     aspirin  EC 81 MG tablet Take 81 mg by mouth daily.     calcium-vitamin D (OSCAL WITH D) 500-200 MG-UNIT tablet Take 1 tablet by mouth daily with breakfast.     cetirizine (ZYRTEC) 10 MG tablet Take 10 mg by mouth at bedtime.     Cyanocobalamin (B-12) 1000 MCG CAPS Take 1,000 mcg by mouth daily.      EPINEPHrine 0.3 mg/0.3 mL IJ SOAJ injection Inject 0.3 mg into the muscle as needed for anaphylaxis.     fluticasone (FLONASE) 50 MCG/ACT nasal spray Place 2 sprays into both nostrils daily as needed for allergies.      fluticasone-salmeterol (ADVAIR HFA) 115-21 MCG/ACT inhaler Inhale 2 puffs into the lungs 2 (two) times daily.     folic acid (FOLVITE) 1 MG tablet Take 1 mg by mouth daily.     ibuprofen (ADVIL) 800 MG tablet Take 1 tablet (800 mg total) by mouth every 8 (eight) hours as needed for mild pain or moderate pain. 30 tablet 0   inFLIXimab (REMICADE) 100 MG injection Inject into the vein every 6 (six) weeks.     methotrexate (RHEUMATREX) 2.5 MG tablet Take 17.5 mg by mouth every Friday. Caution:Chemotherapy. Protect from light.  6 tablets on Fridays     montelukast (SINGULAIR) 10 MG tablet Take 10 mg by mouth at bedtime.     Multiple Vitamin (MULTIVITAMIN WITH MINERALS) TABS tablet Take 1 tablet by mouth daily.     omeprazole (PRILOSEC) 40 MG capsule Take 40 mg by mouth daily.     potassium gluconate 595 (99 K) MG TABS tablet Take 595 mg by mouth daily.     Probiotic Product (PROBIOTIC PO) Take 1 tablet by mouth 2 (two) times daily.      triamterene-hydrochlorothiazide (MAXZIDE-25) 37.5-25 MG tablet Take 1 tablet by mouth every 14 (fourteen) days.     trolamine salicylate (ASPERCREME) 10 % cream Apply 1 application topically 3 (three) times daily as needed for muscle pain.      vitamin E 180 MG (400 UNITS)  capsule Take 400 Units by mouth daily.     carvedilol (COREG) 6.25 MG tablet Take 6.25 mg by mouth 2 (two) times daily.      torsemide (DEMADEX) 5 MG tablet Take 5 mg by mouth daily.      Current Facility-Administered Medications on File Prior to Visit  Medication Dose Route Frequency Provider Last Rate Last Admin   clobetasol ointment (TEMOVATE) 0.05 %   Topical Once per day on Mon Thu Calisa Luckenbaugh James, MD          Objective:     Vitals:   01/29/23 1056  BP: (!) 146/89  Pulse: 61   Filed Weights   01/29/23 1056  Weight: 207 lb 8 oz (94.1 kg)              Physical examination   Pelvic:   Vulva: Significant leukoplakia surrounding vagina including both labia.  Some anterior labial fusion noted.  Patient uncomfortable with any stretching or spreading of this area.  Vagina: No lesions or abnormalities noted.  Support: Normal pelvic support.  Urethra No masses tenderness or scarring.  Meatus Normal size without lesions or prolapse.  Cervix: Normal appearance.  No lesions.  Anus: Normal exam.  No lesions.  Perineum: Normal exam.  No lesions.             Assessment:    [id number] Patient Active Problem List   Diagnosis Date Noted   Stress due to  illness of family member 05/08/2018   S/P total knee arthroplasty [id number]   Lichen sclerosus [id number]   Vaginal atrophy 10/27/2015   Unstable bladder 10/27/2015   Adult BMI 30+ 10/01/2014   Arthritis, degenerative 03/08/2014   Asthma without status asthmaticus 02/28/2014   B12 deficiency 02/28/2014   Accumulation of fluid in tissues 02/28/2014   Acid reflux 02/28/2014   HLD (hyperlipidemia) 02/28/2014   BP (high blood pressure) 02/28/2014   Other allergic rhinitis 02/28/2014   Psoriatic arthritis ([place]) 02/28/2014     1. Lichen sclerosus   2. Vaginal atrophy     Lichen sclerosus approximately the same as last visit and relatively asymptomatic.   Plan:            1.  Strongly recommend more systematic use of  clobetasol.  Patient to use daily for 1 month followed by twice weekly.  2.  Continue twice weekly intravaginal estrogen  If lichen sclerosus worse or not improved consider vulvar biopsy. Orders No orders of the defined types were placed in this encounter.    Meds ordered this encounter  Medications   estradiol (ESTRACE) 0.1 MG/GM vaginal cream    Sig: PLACE 0.5 APPLICATORFUL ( 2 GRAMS) VAGINALLY THREE TIMES PER WEEK AS DIRECTED    Dispense:  42.5 g    Refill:  3   TEMOVATE 0.05 %    Sig: Apply 1 Application topically daily.    Dispense:  30 g    Refill:  4      F/U  Return in about 3 months (around 05/01/2023). I spent 22 minutes involved in the care of this patient preparing to see the patient by obtaining and reviewing her medical history (including labs, imaging tests and prior procedures), documenting clinical information in the electronic health record (EHR), counseling and coordinating care plans, writing and sending prescriptions, ordering tests or procedures and in direct communicating with the patient and medical staff discussing pertinent items from her history and physical exam.  Finis Bud, M.D. 01/29/2023 11:21 AM

## 2023-01-29 NOTE — Progress Notes (Signed)
Patient presents today to follow-up on her lichen sclerous. She states over the past few months not using her estradiol and clobetasol creams as prescribed. 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 No additional concerns as of now.

## 2023-06-03 ENCOUNTER — Encounter
Admission: RE | Disposition: A | Discharge status: Home / Self Care | Payer: Self-pay | Source: Home / Self Care | Attending: Gastroenterology

## 2023-06-03 ENCOUNTER — Ambulatory Visit: Payer: Medicare Other | Admitting: Anesthesiology

## 2023-06-03 ENCOUNTER — Ambulatory Visit
Admission: RE | Admit: 2023-06-03 | Discharge: 2023-06-03 | Disposition: A | Discharge status: Home / Self Care | Payer: Medicare Other | Attending: Gastroenterology | Admitting: Gastroenterology

## 2023-06-03 ENCOUNTER — Encounter: Payer: Self-pay | Admitting: Gastroenterology

## 2023-06-03 DIAGNOSIS — R1030 Lower abdominal pain, unspecified: Secondary | ICD-10-CM | POA: Diagnosis not present

## 2023-06-03 DIAGNOSIS — Z87891 Personal history of nicotine dependence: Secondary | ICD-10-CM | POA: Insufficient documentation

## 2023-06-03 DIAGNOSIS — K640 First degree hemorrhoids: Secondary | ICD-10-CM | POA: Diagnosis not present

## 2023-06-03 DIAGNOSIS — I10 Essential (primary) hypertension: Secondary | ICD-10-CM | POA: Diagnosis not present

## 2023-06-03 DIAGNOSIS — K5730 Diverticulosis of large intestine without perforation or abscess without bleeding: Secondary | ICD-10-CM | POA: Insufficient documentation

## 2023-06-03 DIAGNOSIS — J4489 Other specified chronic obstructive pulmonary disease: Secondary | ICD-10-CM | POA: Insufficient documentation

## 2023-06-03 DIAGNOSIS — Z83719 Family history of colon polyps, unspecified: Secondary | ICD-10-CM | POA: Diagnosis not present

## 2023-06-03 DIAGNOSIS — K580 Irritable bowel syndrome with diarrhea: Secondary | ICD-10-CM | POA: Diagnosis not present

## 2023-06-03 DIAGNOSIS — Z79899 Other long term (current) drug therapy: Secondary | ICD-10-CM | POA: Diagnosis not present

## 2023-06-03 DIAGNOSIS — L4050 Arthropathic psoriasis, unspecified: Secondary | ICD-10-CM | POA: Diagnosis not present

## 2023-06-03 DIAGNOSIS — K219 Gastro-esophageal reflux disease without esophagitis: Secondary | ICD-10-CM | POA: Diagnosis not present

## 2023-06-03 HISTORY — PX: COLONOSCOPY WITH PROPOFOL: SHX5780

## 2023-06-03 HISTORY — PX: BIOPSY: SHX5522

## 2023-06-03 SURGERY — COLONOSCOPY WITH PROPOFOL
Anesthesia: General

## 2023-06-03 MED ORDER — PROPOFOL 10 MG/ML IV BOLUS
INTRAVENOUS | Status: DC | PRN
  Administered 2023-06-03: 20 mg via INTRAVENOUS
  Administered 2023-06-03: 30 mg via INTRAVENOUS

## 2023-06-03 MED ORDER — LIDOCAINE HCL (CARDIAC) PF 100 MG/5ML IV SOSY
PREFILLED_SYRINGE | INTRAVENOUS | Status: DC | PRN
  Administered 2023-06-03: 50 mg via INTRAVENOUS

## 2023-06-03 MED ORDER — PROPOFOL 500 MG/50ML IV EMUL
INTRAVENOUS | Status: DC | PRN
  Administered 2023-06-03: 25 ug/kg/min via INTRAVENOUS

## 2023-06-03 MED ORDER — SODIUM CHLORIDE 0.9 % IV SOLN: INTRAVENOUS | Status: DC

## 2023-06-03 NOTE — Transfer of Care (Signed)
Immediate Anesthesia Transfer of Care Note  Patient: Emily Fox  Procedure(s) Performed: COLONOSCOPY WITH PROPOFOL BIOPSY  Patient Location: PACU  Anesthesia Type:General  Level of Consciousness: sedated  Airway & Oxygen Therapy: Patient Spontanous Breathing  Post-op Assessment: Report given to RN and Post -op Vital signs reviewed and stable  Post vital signs: Reviewed and stable  Last Vitals:  Vitals Value Taken Time  BP 126/70 06/03/23 1415  Temp    Pulse 77 06/03/23 1415  Resp 18 06/03/23 1415  SpO2 100 % 06/03/23 1415  Vitals shown include unfiled device data.  Last Pain:  Vitals:   06/03/23 1239  TempSrc: Tympanic  PainSc: 0-No pain         Complications: No notable events documented.

## 2023-06-03 NOTE — Interval H&P Note (Signed)
History and Physical Interval Note: Preprocedure H&P from 06/03/23  was reviewed and there was no interval change after seeing and examining the patient.  Written consent was obtained from the patient after discussion of risks, benefits, and alternatives. Patient has consented to proceed with Colonoscopy with possible intervention   06/03/2023 1:39 PM  Emily Fox  has presented today for surgery, with the diagnosis of 564.1 (ICD-9-CM) - K58.0 (ICD-10-CM) - Irritable bowel syndrome with diarrhea789.09 (ICD-9-CM) - R10.30 (ICD-10-CM) - Lower abdominal pain.  The various methods of treatment have been discussed with the patient and family. After consideration of risks, benefits and other options for treatment, the patient has consented to  Procedure(s): COLONOSCOPY WITH PROPOFOL (N/A) as a surgical intervention.  The patient's history has been reviewed, patient examined, no change in status, stable for surgery.  I have reviewed the patient's chart and labs.  Questions were answered to the patient's satisfaction.     Jaynie Collins

## 2023-06-03 NOTE — H&P (Signed)
Pre-Procedure H&P   Patient ID: Emily Fox is a 82 y.o. female.  Gastroenterology Provider: Jaynie Collins, DO  Referring Provider: Fransico Setters, NP PCP: Lauro Regulus, MD  Date: 06/03/2023  HPI [name] is a 82 y.o. female who presents today for Colonoscopy for Irritable Bowel Syndrome, Abdominal Pain, Fhx colon polyps .  She reports 3-4 bm per day. No melena/hematochezia. Occasional abdominal discomfort resolves with bowel movement she has had intentional weight loss going from 225 pounds to 206 pounds.  Celiac testing negative.  Gallbladder still intact.  She is status post hysterectomy.  Previous CT revealed pancreatic atrophy  Last colonoscopy August 2019 demonstrated total hemorrhoids and sigmoid diverticulosis.  Also underwent colonoscopy in March 2014 demonstrating internal hemorrhoids  Patient's son and sister with history of polyps  Past Medical History:  Diagnosis Date   Asthma    Complication of anesthesia    difficulty waking up in 1980's    COPD (chronic obstructive pulmonary disease) ([facility])    Diverticula of colon    GERD (gastroesophageal reflux disease)    History of kidney stones    History of shingles    Hypertension    Increased BMI    Lichen sclerosus    Lower extremity edema    Menopause    Osteoarthritis    Overactive bladder    Psoriatic arthritis ([facility])    Seasonal allergies    Urinary incontinence     Past Surgical History:  Procedure Laterality Date   COLONOSCOPY WITH PROPOFOL N/A 06/23/2018   Procedure: COLONOSCOPY WITH PROPOFOL;  Surgeon: Scot Jun, MD;  Location: [facility] ENDOSCOPY;  Service: Endoscopy;  Laterality: N/A;   FRACTURE SURGERY     right leg   GANGLION CYST EXCISION     JOINT REPLACEMENT     left knee   KNEE ARTHROPLASTY Left 12/05/2016   Procedure: COMPUTER ASSISTED TOTAL KNEE ARTHROPLASTY;  Surgeon: Donato Heinz, MD;  Location: [facility] ORS;  Service: Orthopedics;  Laterality: Left;   knee  replacement     lower leg fracture     SHOULDER ARTHROSCOPY WITH ROTATOR CUFF REPAIR AND SUBACROMIAL DECOMPRESSION Left 04/11/2021   Procedure: ARTHROSCOPY SHOULDER WITH DEBRIDEMENT, DECOMPRESSION, ROTATOR CUFF REPAIR;  Surgeon: Christena Flake, MD;  Location: [facility] ORS;  Service: Orthopedics;  Laterality: Left;   TONSILLECTOMY     VAGINAL HYSTERECTOMY     XI ROBOTIC ASSISTED VENTRAL HERNIA N/A 10/02/2019   Procedure: XI ROBOTIC ASSISTED VENTRAL HERNIA;  Surgeon: Sung Amabile, DO;  Location: [facility] ORS;  Service: General;  Laterality: N/A;    Family History Son and sister with colon polyps No h/o GI disease or malignancy  Review of Systems  Constitutional:  Negative for activity change, appetite change, chills, diaphoresis, fatigue, fever and unexpected weight change.  HENT:  Negative for trouble swallowing and voice change.   Respiratory:  Negative for shortness of breath and wheezing.   Cardiovascular:  Negative for chest pain, palpitations and leg swelling.  Gastrointestinal:  Positive for diarrhea. Negative for abdominal distention, abdominal pain, anal bleeding, blood in stool, constipation, nausea, rectal pain and vomiting.  Musculoskeletal:  Negative for arthralgias and myalgias.  Skin:  Negative for color change and pallor.  Neurological:  Negative for dizziness, syncope and weakness.  Psychiatric/Behavioral:  Negative for confusion.   All other systems reviewed and are negative.    Medications No current facility-administered medications on file prior to encounter.   Current Outpatient Medications on File Prior to Encounter  Medication Sig Dispense Refill   acetaminophen (TYLENOL) 500 MG tablet Take 1,000 mg by mouth every 8 (eight) hours as needed for moderate pain.     albuterol (VENTOLIN HFA) 108 (90 Base) MCG/ACT inhaler Inhale 2 puffs into the lungs every 4 (four) hours as needed for wheezing or shortness of breath.      ascorbic acid (VITAMIN C) 500 MG tablet Take 500 mg by  mouth daily.     aspirin EC 81 MG tablet Take 81 mg by mouth daily.     calcium-vitamin D (OSCAL WITH D) 500-200 MG-UNIT tablet Take 1 tablet by mouth daily with breakfast.     cetirizine (ZYRTEC) 10 MG tablet Take 10 mg by mouth at bedtime.     Cyanocobalamin (B-12) 1000 MCG CAPS Take 1,000 mcg by mouth daily.      estradiol (ESTRACE) 0.1 MG/GM vaginal cream PLACE 0.5 APPLICATORFUL ( 2 GRAMS) VAGINALLY THREE TIMES PER WEEK AS DIRECTED 42.5 g 3   fluticasone (FLONASE) 50 MCG/ACT nasal spray Place 2 sprays into both nostrils daily as needed for allergies.      fluticasone-salmeterol (ADVAIR HFA) 115-21 MCG/ACT inhaler Inhale 2 puffs into the lungs 2 (two) times daily.     folic acid (FOLVITE) 1 MG tablet Take 1 mg by mouth daily.     inFLIXimab (REMICADE) 100 MG injection Inject into the vein every 6 (six) weeks.     methotrexate (RHEUMATREX) 2.5 MG tablet Take 17.5 mg by mouth every Friday. Caution:Chemotherapy. Protect from light.  6 tablets on Fridays     montelukast (SINGULAIR) 10 MG tablet Take 10 mg by mouth at bedtime.     Multiple Vitamin (MULTIVITAMIN WITH MINERALS) TABS tablet Take 1 tablet by mouth daily.     omeprazole (PRILOSEC) 40 MG capsule Take 40 mg by mouth daily.     potassium gluconate 595 (99 K) MG TABS tablet Take 595 mg by mouth daily.     Probiotic Product (PROBIOTIC PO) Take 1 tablet by mouth 2 (two) times daily.      TEMOVATE 0.05 % Apply 1 Application topically daily. 30 g 4   triamterene-hydrochlorothiazide (MAXZIDE-25) 37.5-25 MG tablet Take 1 tablet by mouth every 14 (fourteen) days.     trolamine salicylate (ASPERCREME) 10 % cream Apply 1 application topically 3 (three) times daily as needed for muscle pain.      vitamin E 180 MG (400 UNITS) capsule Take 400 Units by mouth daily.     carvedilol (COREG) 6.25 MG tablet Take 6.25 mg by mouth 2 (two) times daily.      EPINEPHrine 0.3 mg/0.3 mL IJ SOAJ injection Inject 0.3 mg into the muscle as needed for anaphylaxis.      ibuprofen (ADVIL) 800 MG tablet Take 1 tablet (800 mg total) by mouth every 8 (eight) hours as needed for mild pain or moderate pain. 30 tablet 0   torsemide (DEMADEX) 5 MG tablet Take 5 mg by mouth daily.       Pertinent medications related to GI and procedure were reviewed by me with the patient prior to the procedure   Current Facility-Administered Medications:    0.9 %  sodium chloride infusion, , Intravenous, Continuous, Jaynie Collins, DO, Last Rate: 20 mL/hr at 06/03/23 1256, New Bag at 06/03/23 1256  sodium chloride 20 mL/hr at 06/03/23 1256       Allergies  Allergen Reactions   Hylan G-F 20 Shortness Of Breath   Nutmeg Oil (Myristica Oil) Shortness Of Breath  Nutmeg the spice   Lisinopril Other (See Comments)    Hallucinations   Cabbage     Has swelling   Penicillins Swelling and Rash    Has patient had a PCN reaction causing immediate rash, facial/tongue/throat swelling, SOB or lightheadedness with hypotension: unknown Has patient had a PCN reaction causing severe rash involving mucus membranes or skin necrosis: No Has patient had a PCN reaction that required hospitalization No Has patient had a PCN reaction occurring within the last 10 years: No If all of the above answers are "NO", then may proceed with Cephalosporin use.    Allergies were reviewed by me prior to the procedure  Objective   Body mass index is 36.03 kg/m. Vitals:   06/03/23 1239  BP: (!) 161/81  Pulse: 83  Resp: 18  Temp: (!) 96.8 F (36 C)  TempSrc: Tympanic  SpO2: 99%  Weight: 92.3 kg  Height: 5\' 3"  (1.6 m)     Physical Exam Vitals and nursing note reviewed.  Constitutional:      General: She is not in acute distress.    Appearance: Normal appearance. She is obese. She is not ill-appearing, toxic-appearing or diaphoretic.  HENT:     Head: Normocephalic and atraumatic.     Nose: Nose normal.     Mouth/Throat:     Mouth: Mucous membranes are moist.     Pharynx:  Oropharynx is clear.  Eyes:     General: No scleral icterus.    Extraocular Movements: Extraocular movements intact.  Cardiovascular:     Rate and Rhythm: Normal rate and regular rhythm.     Heart sounds: Normal heart sounds. No murmur heard.    No friction rub. No gallop.  Pulmonary:     Effort: Pulmonary effort is normal. No respiratory distress.     Breath sounds: Normal breath sounds. No wheezing, rhonchi or rales.  Abdominal:     General: Bowel sounds are normal. There is no distension.     Palpations: Abdomen is soft.     Tenderness: There is no abdominal tenderness. There is no guarding or rebound.  Musculoskeletal:     Cervical back: Neck supple.     Right lower leg: No edema.     Left lower leg: No edema.  Skin:    General: Skin is warm and dry.     Coloration: Skin is not jaundiced or pale.  Neurological:     General: No focal deficit present.     Mental Status: She is alert and oriented to person, place, and time. Mental status is at baseline.  Psychiatric:        Mood and Affect: Mood normal.        Behavior: Behavior normal.        Thought Content: Thought content normal.        Judgment: Judgment normal.      Assessment:  Emily Fox is a 82 y.o. female  who presents today for Colonoscopy for Irritable Bowel Syndrome, Abdominal Pain, Fhx colon polyps .  Plan:  Colonoscopy with possible intervention today  Colonoscopy with possible biopsy, control of bleeding, polypectomy, and interventions as necessary has been discussed with the patient/patient representative. Informed consent was obtained from the patient/patient representative after explaining the indication, nature, and risks of the procedure including but not limited to death, bleeding, perforation, missed neoplasm/lesions, cardiorespiratory compromise, and reaction to medications. Opportunity for questions was given and appropriate answers were provided. Patient/patient representative has verbalized  understanding is amenable  to undergoing the procedure.   Jaynie Collins, DO  [facility]  Portions of the record may have been created with voice recognition software. Occasional wrong-word or 'sound-a-like' substitutions may have occurred due to the inherent limitations of voice recognition software.  Read the chart carefully and recognize, using context, where substitutions may have occurred.

## 2023-06-03 NOTE — Anesthesia Preprocedure Evaluation (Signed)
Anesthesia Evaluation  Patient identified by MRN, date of birth, ID band Patient awake    Reviewed: Allergy & Precautions, NPO status , Patient's Chart, lab work & pertinent test results  History of Anesthesia Complications Negative for: history of anesthetic complications  Airway Mallampati: II  TM Distance: >3 FB Neck ROM: Full    Dental  (+) Missing, Poor Dentition, Dental Advidsory Given   Pulmonary neg shortness of breath, asthma , COPD,  COPD inhaler, former smoker   breath sounds clear to auscultation- rhonchi (-) wheezing      Cardiovascular hypertension, Pt. on medications (-) CAD, (-) Past MI, (-) Cardiac Stents and (-) CABG  Rhythm:Regular Rate:Normal - Systolic murmurs and - Diastolic murmurs    Neuro/Psych neg Seizures negative neurological ROS  negative psych ROS   GI/Hepatic Neg liver ROS,GERD  ,,  Endo/Other  negative endocrine ROSneg diabetes    Renal/GU negative Renal ROS     Musculoskeletal  (+) Arthritis ,    Abdominal  (+) + obese  Peds  Hematology negative hematology ROS (+)   Anesthesia Other Findings Past Medical History: No date: Asthma No date: Complication of anesthesia     Comment:  difficulty waking up in 1980's  No date: COPD (chronic obstructive pulmonary disease) (HCC) No date: Diverticula of colon No date: GERD (gastroesophageal reflux disease) No date: History of kidney stones No date: History of shingles No date: Hypertension No date: Increased BMI No date: Lichen sclerosus No date: Lower extremity edema No date: Menopause No date: Osteoarthritis No date: Overactive bladder No date: Psoriatic arthritis (HCC) No date: Seasonal allergies No date: Urinary incontinence   Reproductive/Obstetrics                             Anesthesia Physical Anesthesia Plan  ASA: 3  Anesthesia Plan: General   Post-op Pain Management:  Regional for Post-op  pain and Minimal or no pain anticipated   Induction: Intravenous  PONV Risk Score and Plan: 3 and Propofol infusion, TIVA and Ondansetron  Airway Management Planned: Nasal Cannula  Additional Equipment: None  Intra-op Plan:   Post-operative Plan: Extubation in OR  Informed Consent: I have reviewed the patients History and Physical, chart, labs and discussed the procedure including the risks, benefits and alternatives for the proposed anesthesia with the patient or authorized representative who has indicated his/her understanding and acceptance.     Dental advisory given  Plan Discussed with: CRNA and Surgeon  Anesthesia Plan Comments: (Discussed risks of anesthesia with patient, including possibility of difficulty with spontaneous ventilation under anesthesia necessitating airway intervention, PONV, and rare risks such as cardiac or respiratory or neurological events, and allergic reactions. Discussed the role of CRNA in patient's perioperative care. Patient understands.)        Anesthesia Quick Evaluation

## 2023-06-03 NOTE — Op Note (Signed)
[facility] : [facility] Gastroenterology Patient Name: Emily Fox Procedure Date: 06/03/2023 1:33 PM MRN: [id number] Account #: [id number] Date of Birth: 04-14-1941 Admit Type: Outpatient Age: 82 Room: [facility] ENDO ROOM 2 Gender: Female Note Status: Finalized Instrument Name: Colonoscope [id number] Procedure:             Colonoscopy Indications:           Chronic diarrhea Providers:             Jaynie Collins DO, DO Medicines:             Monitored Anesthesia Care Complications:         No immediate complications. Estimated blood loss:                         Minimal. Procedure:             Pre-Anesthesia Assessment:                        - Prior to the procedure, a History and Physical was                         performed, and patient medications and allergies were                         reviewed. The patient is competent. The risks and                         benefits of the procedure and the sedation options and                         risks were discussed with the patient. All questions                         were answered and informed consent was obtained.                         Patient identification and proposed procedure were                         verified by the physician, the nurse, the anesthetist                         and the technician in the endoscopy suite. Mental                         Status Examination: alert and oriented. Airway                         Examination: normal oropharyngeal airway and neck                         mobility. Respiratory Examination: clear to                         auscultation. CV Examination: RRR, no murmurs, no S3                         or S4. Prophylactic Antibiotics: The patient does not  require prophylactic antibiotics. Prior                         Anticoagulants: The patient has taken no anticoagulant                         or antiplatelet agents. ASA Grade Assessment: III - A                          patient with severe systemic disease. After reviewing                         the risks and benefits, the patient was deemed in                         satisfactory condition to undergo the procedure. The                         anesthesia plan was to use monitored anesthesia care                         (MAC). Immediately prior to administration of                         medications, the patient was re-assessed for adequacy                         to receive sedatives. The heart rate, respiratory                         rate, oxygen saturations, blood pressure, adequacy of                         pulmonary ventilation, and response to care were                         monitored throughout the procedure. The physical                         status of the patient was re-assessed after the                         procedure.                        After obtaining informed consent, the colonoscope was                         passed under direct vision. Throughout the procedure,                         the patient's blood pressure, pulse, and oxygen                         saturations were monitored continuously. The                         Colonoscope was introduced through the anus and  advanced to the the terminal ileum, with                         identification of the appendiceal orifice and IC                         valve. The colonoscopy was performed without                         difficulty. The patient tolerated the procedure well.                         The quality of the bowel preparation was evaluated                         using the BBPS [facility] Bowel Preparation Scale) with                         scores of: Right Colon = 2 (minor amount of residual                         staining, small fragments of stool and/or opaque                         liquid, but mucosa seen well), Transverse Colon = 2                         (minor amount  of residual staining, small fragments of                         stool and/or opaque liquid, but mucosa seen well) and                         Left Colon = 3 (entire mucosa seen well with no                         residual staining, small fragments of stool or opaque                         liquid). The total BBPS score equals 7. The quality of                         the bowel preparation was good. The terminal ileum,                         ileocecal valve, appendiceal orifice, and rectum were                         photographed. Findings:      The perianal and digital rectal examinations were normal. Pertinent       negatives include normal sphincter tone.      The terminal ileum appeared normal. Estimated blood loss: none.      The colon (entire examined portion) appeared normal. Biopsies for       histology were taken with a cold forceps from the right colon and left       colon for evaluation of microscopic colitis.  Estimated blood loss was       minimal.      Multiple small-mouthed diverticula were found in the sigmoid colon.       Estimated blood loss: none.      Non-bleeding internal hemorrhoids were found during retroflexion. The       hemorrhoids were Grade I (internal hemorrhoids that do not prolapse).       Estimated blood loss: none.      The exam was otherwise without abnormality on direct and retroflexion       views. Impression:            - The examined portion of the ileum was normal.                        - The entire examined colon is normal. Biopsied.                        - Diverticulosis in the sigmoid colon.                        - Non-bleeding internal hemorrhoids.                        - The examination was otherwise normal on direct and                         retroflexion views. Recommendation:        - Patient has a contact number available for                         emergencies. The signs and symptoms of potential                         delayed  complications were discussed with the patient.                         Return to normal activities tomorrow. Written                         discharge instructions were provided to the patient.                        - Discharge patient to home.                        - Resume previous diet.                        - Continue present medications.                        - Await pathology results.                        - Repeat colonoscopy is not recommended due to current                         age (25 years or older) for surveillance based on  pathology results.                        - Return to GI office as previously scheduled.                        - The findings and recommendations were discussed with                         the patient. Procedure Code(s):     --- Professional ---                        [id number], Colonoscopy, flexible; with biopsy, single or                         multiple Diagnosis Code(s):     --- Professional ---                        K64.0, First degree hemorrhoids                        K52.9, Noninfective gastroenteritis and colitis,                         unspecified                        K57.30, Diverticulosis of large intestine without                         perforation or abscess without bleeding CPT copyright 2022 American Medical Association. All rights reserved. The codes documented in this report are preliminary and upon coder review may  be revised to meet current compliance requirements. Attending Participation:      I personally performed the entire procedure. Elfredia Nevins, DO Jaynie Collins DO, DO 06/03/2023 2:13:52 PM This report has been signed electronically. Number of Addenda: 0 Note Initiated On: 06/03/2023 1:33 PM Scope Withdrawal Time: 0 hours 9 minutes 51 seconds  Total Procedure Duration: 0 hours 18 minutes 33 seconds  Estimated Blood Loss:  Estimated blood loss was minimal.      [facility]

## 2023-06-04 ENCOUNTER — Encounter: Payer: Self-pay | Admitting: Gastroenterology

## 2023-06-04 NOTE — Anesthesia Postprocedure Evaluation (Signed)
Anesthesia Post Note  Patient: Emily Fox  Procedure(s) Performed: COLONOSCOPY WITH PROPOFOL BIOPSY  Patient location during evaluation: PACU Anesthesia Type: General Level of consciousness: awake and alert Pain management: pain level controlled Vital Signs Assessment: post-procedure vital signs reviewed and stable Respiratory status: spontaneous breathing, nonlabored ventilation, respiratory function stable and patient connected to nasal cannula oxygen Cardiovascular status: blood pressure returned to baseline and stable Postop Assessment: no apparent nausea or vomiting Anesthetic complications: no   No notable events documented.   Last Vitals:  Vitals:   06/03/23 1239 06/03/23 1415  BP: (!) 161/81   Pulse: 83   Resp: 18   Temp: (!) 36 C 37 C  SpO2: 99%     Last Pain:  Vitals:   06/03/23 1425  TempSrc:   PainSc: 0-No pain                 Stephanie Coup

## 2024-01-02 ENCOUNTER — Encounter: Payer: Self-pay | Admitting: Obstetrics and Gynecology

## 2024-01-02 ENCOUNTER — Ambulatory Visit (INDEPENDENT_AMBULATORY_CARE_PROVIDER_SITE_OTHER): Payer: Medicare Other | Admitting: Obstetrics and Gynecology

## 2024-01-02 VITALS — BP 132/85 | HR 73 | Ht 63.0 in | Wt 203.4 lb

## 2024-01-02 DIAGNOSIS — L900 Lichen sclerosus et atrophicus: Secondary | ICD-10-CM | POA: Diagnosis not present

## 2024-01-02 DIAGNOSIS — N952 Postmenopausal atrophic vaginitis: Secondary | ICD-10-CM

## 2024-01-02 MED ORDER — ESTRADIOL 0.1 MG/GM VA CREA
TOPICAL_CREAM | VAGINAL | 3 refills | Status: AC
Start: 2024-01-02 — End: ?

## 2024-01-02 MED ORDER — CLOBETASOL PROPIONATE 0.05 % EX CREA
1.0000 | TOPICAL_CREAM | Freq: Two times a day (BID) | CUTANEOUS | 5 refills | Status: AC
Start: 2024-01-02 — End: 2024-12-27

## 2024-01-02 NOTE — Progress Notes (Signed)
Patient presents today as a follow-up for lichen sclerous. She states she has not been using clobetasol or vaginal estrogen for the last few months. Her husband recently passed and she has been grieving.

## 2024-01-02 NOTE — Progress Notes (Signed)
HPI:      [name] is a 83 y.o. [phone] who LMP was No LMP recorded. Patient has had a hysterectomy.  Subjective:   She presents today for follow-up of her lichen sclerosus.  Her husband recently passed and she has been grieving his loss.  She states that for a while she was not taking care of herself.  She has neglected her estrogen and her clobetasol and now has worsening vulvar itching.  She would like to restart the medications. We had a long discussion regarding her grieving process and she seems to be slowly getting better.  She has family who are involved, she travels with her sister, and people from her church are also involved so she definitely has some contacts and support system.    Hx: The following portions of the patient's history were reviewed and updated as appropriate:             She  has a past medical history of Asthma, Complication of anesthesia, COPD (chronic obstructive pulmonary disease) (HCC), Diverticula of colon, GERD (gastroesophageal reflux disease), History of kidney stones, History of shingles, Hypertension, Increased BMI, Lichen sclerosus, Lower extremity edema, Menopause, Osteoarthritis, Overactive bladder, Psoriatic arthritis (HCC), Seasonal allergies, and Urinary incontinence. She does not have any pertinent problems on file. She  has a past surgical history that includes knee replacement; Vaginal hysterectomy; Tonsillectomy; Ganglion cyst excision; lower leg fracture; Knee Arthroplasty (Left, 12/05/2016); Colonoscopy with propofol (N/A, 06/23/2018); XI robotic assisted ventral hernia (N/A, 10/02/2019); Fracture surgery; Joint replacement; Shoulder arthroscopy with rotator cuff repair and subacromial decompression (Left, 04/11/2021); Colonoscopy with propofol (N/A, 06/03/2023); and biopsy (06/03/2023). Her family history includes Diabetes in her mother; Heart disease in her mother. She  reports that she quit smoking about 55 years ago. Her smoking use included  cigarettes. She has never used smokeless tobacco. She reports that she does not drink alcohol and does not use drugs. She has a current medication list which includes the following prescription(s): acetaminophen, albuterol, ascorbic acid, aspirin ec, calcium-vitamin d, cetirizine, b-12, epinephrine, fluticasone, fluticasone-salmeterol, folic acid, ibuprofen, infliximab, methotrexate, montelukast, multivitamin with minerals, omeprazole, potassium gluconate, probiotic product, triamterene-hydrochlorothiazide, trolamine salicylate, vitamin e, carvedilol, clobetasol cream, estradiol, and torsemide, and the following Facility-Administered Medications: clobetasol ointment. She is allergic to hylan g-f 20, nutmeg oil (myristica oil), lisinopril, cabbage, and penicillins.       Review of Systems:  Review of Systems  Constitutional: Denied constitutional symptoms, night sweats, recent illness, fatigue, fever, insomnia and weight loss.  Eyes: Denied eye symptoms, eye pain, photophobia, vision change and visual disturbance.  Ears/Nose/Throat/Neck: Denied ear, nose, throat or neck symptoms, hearing loss, nasal discharge, sinus congestion and sore throat.  Cardiovascular: Denied cardiovascular symptoms, arrhythmia, chest pain/pressure, edema, exercise intolerance, orthopnea and palpitations.  Respiratory: Denied pulmonary symptoms, asthma, pleuritic pain, productive sputum, cough, dyspnea and wheezing.  Gastrointestinal: Denied, gastro-esophageal reflux, melena, nausea and vomiting.  Genitourinary: See HPI for additional information.  Musculoskeletal: Denied musculoskeletal symptoms, stiffness, swelling, muscle weakness and myalgia.  Dermatologic: Denied dermatology symptoms, rash and scar.  Neurologic: Denied neurology symptoms, dizziness, headache, neck pain and syncope.  Psychiatric: Denied psychiatric symptoms, anxiety and depression.  Endocrine: Denied endocrine symptoms including hot flashes and night  sweats.   Meds:   Current Outpatient Medications on File Prior to Visit  Medication Sig Dispense Refill   acetaminophen (TYLENOL) 500 MG tablet Take 1,000 mg by mouth every 8 (eight) hours as needed for moderate pain.     albuterol (VENTOLIN  HFA) 108 (90 Base) MCG/ACT inhaler Inhale 2 puffs into the lungs every 4 (four) hours as needed for wheezing or shortness of breath.      ascorbic acid (VITAMIN C) 500 MG tablet Take 500 mg by mouth daily.     aspirin EC 81 MG tablet Take 81 mg by mouth daily.     calcium-vitamin D (OSCAL WITH D) 500-200 MG-UNIT tablet Take 1 tablet by mouth daily with breakfast.     cetirizine (ZYRTEC) 10 MG tablet Take 10 mg by mouth at bedtime.     Cyanocobalamin (B-12) 1000 MCG CAPS Take 1,000 mcg by mouth daily.      EPINEPHrine 0.3 mg/0.3 mL IJ SOAJ injection Inject 0.3 mg into the muscle as needed for anaphylaxis.     fluticasone (FLONASE) 50 MCG/ACT nasal spray Place 2 sprays into both nostrils daily as needed for allergies.      fluticasone-salmeterol (ADVAIR HFA) 115-21 MCG/ACT inhaler Inhale 2 puffs into the lungs 2 (two) times daily.     folic acid (FOLVITE) 1 MG tablet Take 1 mg by mouth daily.     ibuprofen (ADVIL) 800 MG tablet Take 1 tablet (800 mg total) by mouth every 8 (eight) hours as needed for mild pain or moderate pain. 30 tablet 0   inFLIXimab (REMICADE) 100 MG injection Inject into the vein every 6 (six) weeks.     methotrexate (RHEUMATREX) 2.5 MG tablet Take 17.5 mg by mouth every Friday. Caution:Chemotherapy. Protect from light.  6 tablets on Fridays     montelukast (SINGULAIR) 10 MG tablet Take 10 mg by mouth at bedtime.     Multiple Vitamin (MULTIVITAMIN WITH MINERALS) TABS tablet Take 1 tablet by mouth daily.     omeprazole (PRILOSEC) 40 MG capsule Take 40 mg by mouth daily.     potassium gluconate 595 (99 K) MG TABS tablet Take 595 mg by mouth daily.     Probiotic Product (PROBIOTIC PO) Take 1 tablet by mouth 2 (two) times daily.       triamterene-hydrochlorothiazide (MAXZIDE-25) 37.5-25 MG tablet Take 1 tablet by mouth every 14 (fourteen) days.     trolamine salicylate (ASPERCREME) 10 % cream Apply 1 application topically 3 (three) times daily as needed for muscle pain.      vitamin E 180 MG (400 UNITS) capsule Take 400 Units by mouth daily.     carvedilol (COREG) 6.25 MG tablet Take 6.25 mg by mouth 2 (two) times daily.      torsemide (DEMADEX) 5 MG tablet Take 5 mg by mouth daily.      Current Facility-Administered Medications on File Prior to Visit  Medication Dose Route Frequency Provider Last Rate Last Admin   clobetasol ointment (TEMOVATE) 0.05 %   Topical Once per day on Mon Thu Yussuf Sawyers James, MD          Objective:     Vitals:   01/02/24 1105 01/02/24 1112  BP: (!) 161/83 132/85  Pulse: 73    Filed Weights   01/02/24 1105  Weight: 203 lb 6.4 oz (92.3 kg)              Examination of the vulva reveals significant appearance of lichen sclerosis.  Definitely has become worse since last visit.  Patient difficult to examine as she does not want to have a speculum exam and really does not want me to even touch the area.          Assessment:    [id number] Patient Active  Problem List   Diagnosis Date Noted   Stress due to illness of family member 05/08/2018   S/P total knee arthroplasty 12/05/2016   Lichen sclerosus 10/27/2015   Vaginal atrophy 10/27/2015   Unstable bladder 10/27/2015   Adult BMI 30+ 10/01/2014   Arthritis, degenerative 03/08/2014   Asthma without status asthmaticus 02/28/2014   B12 deficiency 02/28/2014   Accumulation of fluid in tissues 02/28/2014   Acid reflux 02/28/2014   HLD (hyperlipidemia) 02/28/2014   BP (high blood pressure) 02/28/2014   Other allergic rhinitis 02/28/2014   Psoriatic arthritis (HCC) 02/28/2014     1. Lichen sclerosus   2. Vaginal atrophy        Plan:            1.  Restart clobetasol    Recommend daily use for at least 30 days to calm down  inflammatory reaction and decrease itching.  Then twice weekly.  I discussed this in detail with Emily Fox and she understands this taper and the logic of it. At this time I have asked her to hold use of the estrogen because I think it would be complicated using both creams simultaneously as well as placing any kind of applicator in the vagina. Orders No orders of the defined types were placed in this encounter.    Meds ordered this encounter  Medications   estradiol (ESTRACE) 0.1 MG/GM vaginal cream    Sig: PLACE 0.5 APPLICATORFUL ( 2 GRAMS) VAGINALLY THREE TIMES PER WEEK AS DIRECTED    Dispense:  42.5 g    Refill:  3   clobetasol cream (TEMOVATE) 0.05 %    Sig: Apply 1 Application topically 2 (two) times daily. Use for 30 days twice a day as directed then use daily as directed.    Dispense:  60 g    Refill:  5      F/U  Return in about 3 months (around 03/31/2024).  Elonda Husky, M.D. 01/02/2024 11:36 AM

## 2024-03-18 ENCOUNTER — Other Ambulatory Visit: Payer: Self-pay | Admitting: Internal Medicine

## 2024-03-18 ENCOUNTER — Encounter: Payer: Self-pay | Admitting: Internal Medicine

## 2024-03-18 DIAGNOSIS — R519 Headache, unspecified: Secondary | ICD-10-CM

## 2024-04-09 ENCOUNTER — Encounter: Payer: Self-pay | Admitting: Obstetrics and Gynecology

## 2024-04-09 ENCOUNTER — Ambulatory Visit (INDEPENDENT_AMBULATORY_CARE_PROVIDER_SITE_OTHER): Admitting: Obstetrics and Gynecology

## 2024-04-09 VITALS — BP 146/83 | HR 60 | Ht 63.0 in

## 2024-04-09 DIAGNOSIS — N952 Postmenopausal atrophic vaginitis: Secondary | ICD-10-CM

## 2024-04-09 DIAGNOSIS — L900 Lichen sclerosus et atrophicus: Secondary | ICD-10-CM

## 2024-04-09 NOTE — Progress Notes (Signed)
 HPI:      [name] is a 83 y.o. [phone] who LMP was No LMP recorded. Patient has had a hysterectomy.  Subjective:   She presents today for follow-up of her lichen sclerosus.  She used clobetasol  ointment as directed and reports that she is now using it every other day.  She says her symptoms have dramatically improved.  She is now taking care of herself in general "much better".    Hx: The following portions of the patient's history were reviewed and updated as appropriate:             She  has a past medical history of Asthma, Complication of anesthesia, COPD (chronic obstructive pulmonary disease) (HCC), Diverticula of colon, GERD (gastroesophageal reflux disease), History of kidney stones, History of shingles, Hypertension, Increased BMI, Lichen sclerosus, Lower extremity edema, Menopause, Osteoarthritis, Overactive bladder, Psoriatic arthritis (HCC), Seasonal allergies, and Urinary incontinence. She does not have any pertinent problems on file. She  has a past surgical history that includes knee replacement; Vaginal hysterectomy; Tonsillectomy; Ganglion cyst excision; lower leg fracture; Knee Arthroplasty (Left, 12/05/2016); Colonoscopy with propofol  (N/A, 06/23/2018); XI robotic assisted ventral hernia (N/A, 10/02/2019); Fracture surgery; Joint replacement; Shoulder arthroscopy with rotator cuff repair and subacromial decompression (Left, 04/11/2021); Colonoscopy with propofol  (N/A, 06/03/2023); and biopsy (06/03/2023). Her family history includes Diabetes in her mother; Heart disease in her mother. She  reports that she quit smoking about 55 years ago. Her smoking use included cigarettes. She has never used smokeless tobacco. She reports that she does not drink alcohol and does not use drugs. She has a current medication list which includes the following prescription(s): acetaminophen , albuterol , ascorbic acid, aspirin ec, calcium -vitamin d , cetirizine, clobetasol  cream, b-12, epinephrine ,  estradiol , fluticasone , fluticasone -salmeterol, folic acid , ibuprofen , infliximab, methotrexate, montelukast , multivitamin with minerals, omeprazole, potassium gluconate, probiotic product, triamterene -hydrochlorothiazide , trolamine salicylate, vitamin e , carvedilol, and torsemide, and the following Facility-Administered Medications: clobetasol  ointment. She is allergic to hylan g-f 20, nutmeg oil (myristica oil), lisinopril, cabbage, and penicillins.       Review of Systems:  Review of Systems  Constitutional: Denied constitutional symptoms, night sweats, recent illness, fatigue, fever, insomnia and weight loss.  Eyes: Denied eye symptoms, eye pain, photophobia, vision change and visual disturbance.  Ears/Nose/Throat/Neck: Denied ear, nose, throat or neck symptoms, hearing loss, nasal discharge, sinus congestion and sore throat.  Cardiovascular: Denied cardiovascular symptoms, arrhythmia, chest pain/pressure, edema, exercise intolerance, orthopnea and palpitations.  Respiratory: Denied pulmonary symptoms, asthma, pleuritic pain, productive sputum, cough, dyspnea and wheezing.  Gastrointestinal: Denied, gastro-esophageal reflux, melena, nausea and vomiting.  Genitourinary: Denied genitourinary symptoms including symptomatic vaginal discharge, pelvic relaxation issues, and urinary complaints.  Musculoskeletal: Denied musculoskeletal symptoms, stiffness, swelling, muscle weakness and myalgia.  Dermatologic: Denied dermatology symptoms, rash and scar.  Neurologic: Denied neurology symptoms, dizziness, headache, neck pain and syncope.  Psychiatric: Denied psychiatric symptoms, anxiety and depression.  Endocrine: Denied endocrine symptoms including hot flashes and night sweats.   Meds:   Current Outpatient Medications on File Prior to Visit  Medication Sig Dispense Refill   acetaminophen  (TYLENOL ) 500 MG tablet Take 1,000 mg by mouth every 8 (eight) hours as needed for moderate pain.     albuterol   (VENTOLIN  HFA) 108 (90 Base) MCG/ACT inhaler Inhale 2 puffs into the lungs every 4 (four) hours as needed for wheezing or shortness of breath.      ascorbic acid (VITAMIN C) 500 MG tablet Take 500 mg by mouth daily.     aspirin EC 81  MG tablet Take 81 mg by mouth daily.     calcium -vitamin D  (OSCAL WITH D) 500-200 MG-UNIT tablet Take 1 tablet by mouth daily with breakfast.     cetirizine (ZYRTEC) 10 MG tablet Take 10 mg by mouth at bedtime.     clobetasol  cream (TEMOVATE ) 0.05 % Apply 1 Application topically 2 (two) times daily. Use for 30 days twice a day as directed then use daily as directed. 60 g 5   Cyanocobalamin  (B-12) 1000 MCG CAPS Take 1,000 mcg by mouth daily.      EPINEPHrine  0.3 mg/0.3 mL IJ SOAJ injection Inject 0.3 mg into the muscle as needed for anaphylaxis.     estradiol  (ESTRACE ) 0.1 MG/GM vaginal cream PLACE 0.5 APPLICATORFUL ( 2 GRAMS) VAGINALLY THREE TIMES PER WEEK AS DIRECTED 42.5 g 3   fluticasone  (FLONASE ) 50 MCG/ACT nasal spray Place 2 sprays into both nostrils daily as needed for allergies.      fluticasone -salmeterol (ADVAIR HFA) 115-21 MCG/ACT inhaler Inhale 2 puffs into the lungs 2 (two) times daily.     folic acid  (FOLVITE ) 1 MG tablet Take 1 mg by mouth daily.     ibuprofen  (ADVIL ) 800 MG tablet Take 1 tablet (800 mg total) by mouth every 8 (eight) hours as needed for mild pain or moderate pain. 30 tablet 0   inFLIXimab (REMICADE) 100 MG injection Inject into the vein every 6 (six) weeks.     methotrexate (RHEUMATREX) 2.5 MG tablet Take 17.5 mg by mouth every Friday. Caution:Chemotherapy. Protect from light.  6 tablets on Fridays     montelukast  (SINGULAIR ) 10 MG tablet Take 10 mg by mouth at bedtime.     Multiple Vitamin (MULTIVITAMIN WITH MINERALS) TABS tablet Take 1 tablet by mouth daily.     omeprazole (PRILOSEC) 40 MG capsule Take 40 mg by mouth daily.     potassium gluconate 595 (99 K) MG TABS tablet Take 595 mg by mouth daily.     Probiotic Product (PROBIOTIC  PO) Take 1 tablet by mouth 2 (two) times daily.      triamterene -hydrochlorothiazide  (MAXZIDE -25) 37.5-25 MG tablet Take 1 tablet by mouth every 14 (fourteen) days.     trolamine salicylate (ASPERCREME) 10 % cream Apply 1 application topically 3 (three) times daily as needed for muscle pain.      vitamin E  180 MG (400 UNITS) capsule Take 400 Units by mouth daily.     carvedilol (COREG) 6.25 MG tablet Take 6.25 mg by mouth 2 (two) times daily.      torsemide (DEMADEX) 5 MG tablet Take 5 mg by mouth daily.      Current Facility-Administered Medications on File Prior to Visit  Medication Dose Route Frequency Provider Last Rate Last Admin   clobetasol  ointment (TEMOVATE ) 0.05 %   Topical Once per day on Mon Thu Amro Winebarger James, MD          Objective:      Vitals:   04/09/24 1103  BP: (!) 146/83  Pulse: 60   There were no vitals filed for this visit.            Physical examination   Pelvic:   Vulva: Dramatic improvement since last visit.  She does have hypopigmented areas but erythema and irritated areas have resolved.  Vagina: No lesions or abnormalities noted.  Support: Normal pelvic support.  Urethra No masses tenderness or scarring.  Meatus Normal size without lesions or prolapse.  Cervix: Normal appearance.  No lesions.  Anus: Normal exam.  No lesions.  Perineum: Normal exam.  No lesions.            Assessment:     [id number] Patient Active Problem List   Diagnosis Date Noted   Stress due to illness of family member 05/08/2018   S/P total knee arthroplasty 12/05/2016   Lichen sclerosus 10/27/2015   Vaginal atrophy 10/27/2015   Unstable bladder 10/27/2015   Adult BMI 30+ 10/01/2014   Arthritis, degenerative 03/08/2014   Asthma without status asthmaticus 02/28/2014   B12 deficiency 02/28/2014   Accumulation of fluid in tissues 02/28/2014   Acid reflux 02/28/2014   HLD (hyperlipidemia) 02/28/2014   BP (high blood pressure) 02/28/2014   Other allergic rhinitis  02/28/2014   Psoriatic arthritis (HCC) 02/28/2014     1. Lichen sclerosus   2. Vaginal atrophy     Patient now doing well with her lichen sclerosus.   Plan:            1.  Continue clobetasol  twice per week. Orders No orders of the defined types were placed in this encounter.   No orders of the defined types were placed in this encounter.     F/U  Return in about 1 year (around 04/09/2025) for Pt to contact us  if symptoms worsen.  Delice Felt, M.D. 04/09/2024 11:31 AM

## 2024-04-09 NOTE — Progress Notes (Signed)
 Patient presents today to follow-up for lichen sclerous. Since she was last here in February she has been using Clobetasol  and feeling much better, denies any itching at this time.

## 2024-04-14 ENCOUNTER — Ambulatory Visit
Admission: RE | Admit: 2024-04-14 | Discharge: 2024-04-14 | Disposition: A | Discharge status: Home / Self Care | Source: Ambulatory Visit | Attending: Internal Medicine | Admitting: Internal Medicine

## 2024-04-14 DIAGNOSIS — R519 Headache, unspecified: Secondary | ICD-10-CM | POA: Diagnosis present

## 2024-04-14 MED ORDER — IOHEXOL 350 MG/ML SOLN
75.0000 mL | Freq: Once | INTRAVENOUS | Status: AC | PRN
  Administered 2024-04-14: 75 mL via INTRAVENOUS
